# Patient Record
Sex: Female | Born: 1990
Health system: Southern US, Community
[De-identification: ages and names within clinical notes are randomized; demographics above are authoritative.]

## PROBLEM LIST (undated history)

## (undated) ENCOUNTER — Inpatient Hospital Stay (HOSPITAL_COMMUNITY): Payer: Self-pay

## (undated) DIAGNOSIS — F32A Depression, unspecified: Secondary | ICD-10-CM

## (undated) DIAGNOSIS — F329 Major depressive disorder, single episode, unspecified: Secondary | ICD-10-CM

---
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
## 2013-04-16 NOTE — L&D Delivery Note (Signed)
Delivery Note At 5:07 PM a viable and healthy female was delivered via VBAC, Spontaneous (Presentation: Left Occiput Anterior).  APGAR: 8, 9; weight pending .   Placenta status: Intact, Spontaneous.  Cord: 3 vessels with the following complications: None.   Anesthesia: None  Episiotomy: None Lacerations: Second degree perineal and tear of vaginal septum at delivery Suture Repair: 3.0 vicryl.  Pudental placed by Dr Debroah Loop.  Both sides of torn septum ligated by Dr Debroah Loop using 3.0 vicryl.  Repair of second degree perineal tear in usual fashion using local anesthetic.  Est. Blood Loss (mL): 600.  Uterine bleeding minimal, bleeding from torn septum and second degree tear more significant, resolved with suture.  Bleeding minimal after repair.  Mom to postpartum.  Baby to Couplet care / Skin to Skin.  G2P1001  presented to [facility] with hx of PLTCS after pushing x1 hour with vaginal septum as indication for C/S.  Pt had planned a RLTCS scheduled 8/31 but came in to [facility]U 8/30 in active labor at 4-5 cm with BBOW, then progressed quickly to 9 cm.  Discussed risks and benefits of both TOLAC and RLTCS at this point with pt again and pt signed consent to VBAC prior to delivery.  IV pain medication given, pt progressed to complete and pushed effectively to crowning.  At crowning, vaginal septum tore, infant was delivered without complication.  Infant placed in mother's arms, cord clamping delayed by a few minutes, then clamped and cut by CNM.  Placenta delivered spontaneously with cord traction only, uterus firm with minimal bleeding following delivery of placenta.  Pitocin bolus IV following placenta delivery.  Dr Debroah Loop to bedside to perform repair. Following pudendal placement, anterior and posterior sides of vaginal septum ligated with 3.0 vicryl by Dr Debroah Loop.  Repair of second degree perineal tear in usual fashion by Dr Debroah Loop using local anesthetic. Infant skin to skin with mother x 1 hour, both stable.     LEFTWICH-KIRBY, LISA 12/13/2013, 5:59 PM

## 2013-05-12 ENCOUNTER — Other Ambulatory Visit: Payer: Self-pay

## 2013-05-15 ENCOUNTER — Other Ambulatory Visit: Payer: Self-pay | Admitting: Obstetrics & Gynecology

## 2013-05-15 DIAGNOSIS — O3680X0 Pregnancy with inconclusive fetal viability, not applicable or unspecified: Secondary | ICD-10-CM

## 2013-05-18 ENCOUNTER — Other Ambulatory Visit: Payer: Self-pay | Admitting: Obstetrics & Gynecology

## 2013-05-18 ENCOUNTER — Encounter (INDEPENDENT_AMBULATORY_CARE_PROVIDER_SITE_OTHER): Payer: Self-pay

## 2013-05-18 ENCOUNTER — Encounter: Payer: Self-pay | Admitting: Obstetrics & Gynecology

## 2013-05-18 ENCOUNTER — Ambulatory Visit (INDEPENDENT_AMBULATORY_CARE_PROVIDER_SITE_OTHER): Payer: Medicaid Other

## 2013-05-18 DIAGNOSIS — O3680X0 Pregnancy with inconclusive fetal viability, not applicable or unspecified: Secondary | ICD-10-CM

## 2013-05-18 DIAGNOSIS — O34219 Maternal care for unspecified type scar from previous cesarean delivery: Secondary | ICD-10-CM

## 2013-05-18 NOTE — Progress Notes (Signed)
U/S(9+1wks)-single IUP with +FCA noted, FHR- 165 bpm, cx appears closed, bilateral adnexa wnl, CRL c/w LMP dates

## 2013-05-26 ENCOUNTER — Encounter: Payer: Self-pay | Admitting: Advanced Practice Midwife

## 2013-06-01 ENCOUNTER — Ambulatory Visit (INDEPENDENT_AMBULATORY_CARE_PROVIDER_SITE_OTHER): Payer: Medicaid Other | Admitting: Women's Health

## 2013-06-01 ENCOUNTER — Encounter: Payer: Self-pay | Admitting: Women's Health

## 2013-06-01 ENCOUNTER — Other Ambulatory Visit (HOSPITAL_COMMUNITY)
Admission: RE | Admit: 2013-06-01 | Discharge: 2013-06-01 | Disposition: A | Discharge status: Home / Self Care | Payer: Medicaid Other | Source: Ambulatory Visit | Attending: Advanced Practice Midwife | Admitting: Advanced Practice Midwife

## 2013-06-01 VITALS — BP 108/50 | Ht 61.0 in | Wt 170.5 lb

## 2013-06-01 DIAGNOSIS — Z113 Encounter for screening for infections with a predominantly sexual mode of transmission: Secondary | ICD-10-CM | POA: Insufficient documentation

## 2013-06-01 DIAGNOSIS — Z1389 Encounter for screening for other disorder: Secondary | ICD-10-CM

## 2013-06-01 DIAGNOSIS — Z124 Encounter for screening for malignant neoplasm of cervix: Secondary | ICD-10-CM | POA: Insufficient documentation

## 2013-06-01 DIAGNOSIS — Z98891 History of uterine scar from previous surgery: Secondary | ICD-10-CM | POA: Insufficient documentation

## 2013-06-01 DIAGNOSIS — Z1151 Encounter for screening for human papillomavirus (HPV): Secondary | ICD-10-CM | POA: Insufficient documentation

## 2013-06-01 DIAGNOSIS — N9089 Other specified noninflammatory disorders of vulva and perineum: Secondary | ICD-10-CM

## 2013-06-01 DIAGNOSIS — Z3480 Encounter for supervision of other normal pregnancy, unspecified trimester: Secondary | ICD-10-CM

## 2013-06-01 DIAGNOSIS — R87810 Cervical high risk human papillomavirus (HPV) DNA test positive: Secondary | ICD-10-CM | POA: Insufficient documentation

## 2013-06-01 DIAGNOSIS — Z331 Pregnant state, incidental: Secondary | ICD-10-CM

## 2013-06-01 DIAGNOSIS — O99019 Anemia complicating pregnancy, unspecified trimester: Secondary | ICD-10-CM

## 2013-06-01 DIAGNOSIS — O34219 Maternal care for unspecified type scar from previous cesarean delivery: Secondary | ICD-10-CM

## 2013-06-01 LAB — POCT URINALYSIS DIPSTICK
GLUCOSE UA: NEGATIVE
Ketones, UA: NEGATIVE
NITRITE UA: NEGATIVE
PROTEIN UA: NEGATIVE
RBC UA: NEGATIVE

## 2013-06-01 LAB — CBC
HEMATOCRIT: 33.3 % — AB (ref 36.0–46.0)
Hemoglobin: 11 g/dL — ABNORMAL LOW (ref 12.0–15.0)
MCH: 27.2 pg (ref 26.0–34.0)
MCHC: 33 g/dL (ref 30.0–36.0)
MCV: 82.2 fL (ref 78.0–100.0)
PLATELETS: 182 10*3/uL (ref 150–400)
RBC: 4.05 MIL/uL (ref 3.87–5.11)
RDW: 13.8 % (ref 11.5–15.5)
WBC: 8.5 10*3/uL (ref 4.0–10.5)

## 2013-06-01 NOTE — Patient Instructions (Addendum)
Pregnancy - First Trimester  During sexual intercourse, millions of sperm go into the vagina. Only 1 sperm will penetrate and fertilize the female egg while it is in the Fallopian tube. One week later, the fertilized egg implants into the wall of the uterus. An embryo begins to develop into a baby. At 6 to 8 weeks, the eyes and face are formed and the heartbeat can be seen on ultrasound. At the end of 12 weeks (first trimester), all the baby's organs are formed. Now that you are pregnant, you will want to do everything you can to have a healthy baby. Two of the most important things are to get good prenatal care and follow your caregiver's instructions. Prenatal care is all the medical care you receive before the baby's birth. It is given to prevent, find, and treat problems during the pregnancy and childbirth.  PRENATAL EXAMS  · During prenatal visits, your weight, blood pressure, and urine are checked. This is done to make sure you are healthy and progressing normally during the pregnancy.  · A pregnant woman should gain 25 to 35 pounds during the pregnancy. However, if you are overweight or underweight, your caregiver will advise you regarding your weight.  · Your caregiver will ask and answer questions for you.  · Blood work, cervical cultures, other necessary tests, and a Pap test are done during your prenatal exams. These tests are done to check on your health and the probable health of your baby. Tests are strongly recommended and done for HIV with your permission. This is the virus that causes AIDS. These tests are done because medicines can be given to help prevent your baby from being born with this infection should you have been infected without knowing it. Blood work is also used to find out your blood type, previous infections, and follow your blood levels (hemoglobin).  · Low hemoglobin (anemia) is common during pregnancy. Iron and vitamins are given to help prevent this. Later in the pregnancy, blood  tests for diabetes will be done along with any other tests if any problems develop.  · You may need other tests to make sure you and the baby are doing well.  CHANGES DURING THE FIRST TRIMESTER   Your body goes through many changes during pregnancy. They vary from person to person. Talk to your caregiver about changes you notice and are concerned about. Changes can include:  · Your menstrual period stops.  · The egg and sperm carry the genes that determine what you look like. Genes from you and your partner are forming a baby. The female genes determine whether the baby is a boy or a girl.  · Your body increases in girth and you may feel bloated.  · Feeling sick to your stomach (nauseous) and throwing up (vomiting). If the vomiting is uncontrollable, call your caregiver.  · Your breasts will begin to enlarge and become tender.  · Your nipples may stick out more and become darker.  · The need to urinate more. Painful urination may mean you have a bladder infection.  · Tiring easily.  · Loss of appetite.  · Cravings for certain kinds of food.  · At first, you may gain or lose a couple of pounds.  · You may have changes in your emotions from day to day (excited to be pregnant or concerned something may go wrong with the pregnancy and baby).  · You may have more vivid and strange dreams.  HOME CARE INSTRUCTIONS   ·   It is very important to avoid all smoking, alcohol and non-prescribed drugs during your pregnancy. These affect the formation and growth of the baby. Avoid chemicals while pregnant to ensure the delivery of a healthy infant.  · Start your prenatal visits by the 12th week of pregnancy. They are usually scheduled monthly at first, then more often in the last 2 months before delivery. Keep your caregiver's appointments. Follow your caregiver's instructions regarding medicine use, blood and lab tests, exercise, and diet.  · During pregnancy, you are providing food for you and your baby. Eat regular, well-balanced  meals. Choose foods such as meat, fish, milk and other low fat dairy products, vegetables, fruits, and whole-grain breads and cereals. Your caregiver will tell you of the ideal weight gain.  · You can help morning sickness by keeping soda crackers at the bedside. Eat a couple before arising in the morning. You may want to use the crackers without salt on them.  · Eating 4 to 5 small meals rather than 3 large meals a day also may help the nausea and vomiting.  · Drinking liquids between meals instead of during meals also seems to help nausea and vomiting.  · A physical sexual relationship may be continued throughout pregnancy if there are no other problems. Problems may be early (premature) leaking of amniotic fluid from the membranes, vaginal bleeding, or belly (abdominal) pain.  · Exercise regularly if there are no restrictions. Check with your caregiver or physical therapist if you are unsure of the safety of some of your exercises. Greater weight gain will occur in the last 2 trimesters of pregnancy. Exercising will help:  · Control your weight.  · Keep you in shape.  · Prepare you for labor and delivery.  · Help you lose your pregnancy weight after you deliver your baby.  · Wear a good support or jogging bra for breast tenderness during pregnancy. This may help if worn during sleep too.  · Ask when prenatal classes are available. Begin classes when they are offered.  · Do not use hot tubs, steam rooms, or saunas.  · Wear your seat belt when driving. This protects you and your baby if you are in an accident.  · Avoid raw meat, uncooked cheese, cat litter boxes, and soil used by cats throughout the pregnancy. These carry germs that can cause birth defects in the baby.  · The first trimester is a good time to visit your dentist for your dental health. Getting your teeth cleaned is okay. Use a softer toothbrush and brush gently during pregnancy.  · Ask for help if you have financial, counseling, or nutritional needs  during pregnancy. Your caregiver will be able to offer counseling for these needs as well as refer you for other special needs.  · Do not take any medicines or herbs unless told by your caregiver.  · Inform your caregiver if there is any mental or physical domestic violence.  · Make a list of emergency phone numbers of family, friends, hospital, and police and fire departments.  · Write down your questions. Take them to your prenatal visit.  · Do not douche.  · Do not cross your legs.  · If you have to stand for long periods of time, rotate you feet or take small steps in a circle.  · You may have more vaginal secretions that may require a sanitary pad. Do not use tampons or scented sanitary pads.  MEDICINES AND DRUG USE IN PREGNANCY  ·   Take prenatal vitamins as directed. The vitamin should contain 1 milligram of folic acid. Keep all vitamins out of reach of children. Only a couple vitamins or tablets containing iron may be fatal to a baby or young child when ingested.  · Avoid use of all medicines, including herbs, over-the-counter medicines, not prescribed or suggested by your caregiver. Only take over-the-counter or prescription medicines for pain, discomfort, or fever as directed by your caregiver. Do not use aspirin, ibuprofen, or naproxen unless directed by your caregiver.  · Let your caregiver also know about herbs you may be using.  · Alcohol is related to a number of birth defects. This includes fetal alcohol syndrome. All alcohol, in any form, should be avoided completely. Smoking will cause low birth rate and premature babies.  · Street or illegal drugs are very harmful to the baby. They are absolutely forbidden. A baby born to an addicted mother will be addicted at birth. The baby will go through the same withdrawal an adult does.  · Let your caregiver know about any medicines that you have to take and for what reason you take them.  SEEK MEDICAL CARE IF:   You have any concerns or worries during your  pregnancy. It is better to call with your questions if you feel they cannot wait, rather than worry about them.  SEEK IMMEDIATE MEDICAL CARE IF:   · An unexplained oral temperature above 102° F (38.9° C) develops, or as your caregiver suggests.  · You have leaking of fluid from the vagina (birth canal). If leaking membranes are suspected, take your temperature and inform your caregiver of this when you call.  · There is vaginal spotting or bleeding. Notify your caregiver of the amount and how many pads are used.  · You develop a bad smelling vaginal discharge with a change in the color.  · You continue to feel sick to your stomach (nauseated) and have no relief from remedies suggested. You vomit blood or coffee ground-like materials.  · You lose more than 2 pounds of weight in 1 week.  · You gain more than 2 pounds of weight in 1 week and you notice swelling of your face, hands, feet, or legs.  · You gain 5 pounds or more in 1 week (even if you do not have swelling of your hands, face, legs, or feet).  · You get exposed to German measles and have never had them.  · You are exposed to fifth disease or chickenpox.  · You develop belly (abdominal) pain. Round ligament discomfort is a common non-cancerous (benign) cause of abdominal pain in pregnancy. Your caregiver still must evaluate this.  · You develop headache, fever, diarrhea, pain with urination, or shortness of breath.  · You fall or are in a car accident or have any kind of trauma.  · There is mental or physical violence in your home.  Document Released: 03/27/2001 Document Revised: 12/26/2011 Document Reviewed: 09/28/2008  ExitCare® Patient Information ©2014 [employer].

## 2013-06-01 NOTE — Progress Notes (Addendum)
  Subjective:  Brandy Kaufman is a 23 y.o. 632P1001 Hispanic female at 6673w1d by LMP which differs by 9wk u/s by only 2d,being seen today for her first obstetrical visit.  Her obstetrical history is significant for prev c/s after IOL 2/2 term 2010 d/t 'tissue that wasn't tearing' after 1hr of pushing? Will request records.  Pregnancy history fully reviewed.  Patient reports no complaints. Denies vb, cramping, uti s/s, abnormal/malodorous vag d/c, or vulvovaginal itching/irritation.  BP 108/50  Wt 170 lb 8 oz (77.338 kg)  LMP 03/15/2013  HISTORY: OB History  Gravida Para Term Preterm AB SAB TAB Ectopic Multiple Living  2 1 1       1     # Outcome Date GA Lbr Len/2nd Weight Sex Delivery Anes PTL Lv  2 CUR           1 TRM 10/12/08 6737w0d  7 lb 0.5 oz (3.189 kg) F LTCS EPI  Y     History reviewed. No pertinent past medical history. Past Surgical History  Procedure Laterality Date  . Cesarean section     Family History  Problem Relation Age of Onset  . Diabetes Father     Exam   System:     General: Well developed & nourished, no acute distress   Skin: Warm & dry, normal coloration and turgor, no rashes   Neurologic: Alert & oriented, normal mood   Cardiovascular: Regular rate & rhythm   Respiratory: Effort & rate normal, LCTAB, acyanotic   Abdomen: Soft, non tender   Extremities: normal strength, tone   Pelvic Exam:    Perineum: Normal perineum   Vulva: Normal, multiple small lesions w/ 1 beginning to look slightly ulcerated- HSV culture obtained   Vagina:  Normal mucosa, normal discharge   Cervix: Normal, bulbous, appears closed   Uterus: Normal size/shape/contour for GA   Thin prep pap smear obtained  FHR: 166 via doppler   Assessment:   Pregnancy: G2P1001 Patient Active Problem List   Diagnosis Date Noted  . Supervision of other normal pregnancy 06/01/2013    Priority: High  . Previous cesarean section 06/01/2013    Priority: High    3973w1d G2P1001 New OB  visit Prev c/s after 1hr 2nd stage d/t ?tissue that wouldn't tear   Plan:  Initial labs drawn, including HSV 1&2, and culture of lesion sent Continue prenatal vitamins Problem list reviewed and updated Reviewed n/v relief measures and warning s/s to report Reviewed recommended weight gain based on pre-gravid BMI Encouraged well-balanced diet Genetic Screening discussed Integrated Screen: requested Cystic fibrosis screening discussed declined Ultrasound discussed; fetal survey: requested Follow up in asap for 1st it/nt, then 4wks for lrob CCNC completed [facility]  Marge DuncansBooker, Gotti Alwin Randall CNM, [facility]NP-BC 06/01/2013 10:51 AM

## 2013-06-01 NOTE — Addendum Note (Signed)
Addended by: Shawna ClampBOOKER, Symphanie Cederberg R on: 06/01/2013 11:10 AM   Modules accepted: Orders

## 2013-06-02 LAB — RUBELLA SCREEN: Rubella: 2.58 Index — ABNORMAL HIGH (ref <0.90)

## 2013-06-02 LAB — ABO AND RH: RH TYPE: POSITIVE

## 2013-06-02 LAB — URINALYSIS
BILIRUBIN URINE: NEGATIVE
Glucose, UA: NEGATIVE mg/dL
HGB URINE DIPSTICK: NEGATIVE
Ketones, ur: NEGATIVE mg/dL
Leukocytes, UA: NEGATIVE
Nitrite: NEGATIVE
PROTEIN: NEGATIVE mg/dL
Specific Gravity, Urine: 1.024 (ref 1.005–1.030)
UROBILINOGEN UA: 0.2 mg/dL (ref 0.0–1.0)
pH: 6.5 (ref 5.0–8.0)

## 2013-06-02 LAB — VARICELLA ZOSTER ANTIBODY, IGG: VARICELLA IGG: 449.1 {index} — AB (ref <135.00)

## 2013-06-02 LAB — HSV 2 ANTIBODY, IGG

## 2013-06-02 LAB — RPR

## 2013-06-02 LAB — HEPATITIS B SURFACE ANTIGEN: HEP B S AG: NEGATIVE

## 2013-06-02 LAB — HIV ANTIBODY (ROUTINE TESTING W REFLEX): HIV: NONREACTIVE

## 2013-06-02 LAB — SICKLE CELL SCREEN: SICKLE CELL SCREEN: NEGATIVE

## 2013-06-02 LAB — ANTIBODY SCREEN: ANTIBODY SCREEN: NEGATIVE

## 2013-06-02 LAB — OXYCODONE SCREEN, UA, RFLX CONFIRM: Oxycodone Screen, Ur: NEGATIVE ng/mL

## 2013-06-02 LAB — HSV 1 ANTIBODY, IGG: HSV 1 Glycoprotein G Ab, IgG: 10.43 IV — ABNORMAL HIGH

## 2013-06-03 LAB — DRUG SCREEN, URINE, NO CONFIRMATION
AMPHETAMINE SCRN UR: NEGATIVE
Barbiturate Quant, Ur: NEGATIVE
Benzodiazepines.: NEGATIVE
Cocaine Metabolites: NEGATIVE
Creatinine,U: 165.8 mg/dL
Marijuana Metabolite: NEGATIVE
Methadone: NEGATIVE
Opiate Screen, Urine: NEGATIVE
Phencyclidine (PCP): NEGATIVE
Propoxyphene: NEGATIVE

## 2013-06-03 LAB — URINE CULTURE

## 2013-06-03 LAB — HERPES SIMPLEX VIRUS CULTURE: Organism ID, Bacteria: NOT DETECTED

## 2013-06-08 ENCOUNTER — Encounter: Payer: Self-pay | Admitting: Women's Health

## 2013-06-08 DIAGNOSIS — R87619 Unspecified abnormal cytological findings in specimens from cervix uteri: Secondary | ICD-10-CM | POA: Insufficient documentation

## 2013-06-08 DIAGNOSIS — O3460 Maternal care for abnormality of vagina, unspecified trimester: Secondary | ICD-10-CM | POA: Insufficient documentation

## 2013-06-08 DIAGNOSIS — Q5210 Doubling of vagina, unspecified: Secondary | ICD-10-CM

## 2013-06-10 ENCOUNTER — Ambulatory Visit (INDEPENDENT_AMBULATORY_CARE_PROVIDER_SITE_OTHER): Payer: Medicaid Other

## 2013-06-10 ENCOUNTER — Encounter: Payer: Self-pay | Admitting: Women's Health

## 2013-06-10 ENCOUNTER — Other Ambulatory Visit: Payer: Self-pay | Admitting: Women's Health

## 2013-06-10 ENCOUNTER — Ambulatory Visit (INDEPENDENT_AMBULATORY_CARE_PROVIDER_SITE_OTHER): Payer: Medicaid Other | Admitting: Women's Health

## 2013-06-10 VITALS — BP 100/64 | Wt 171.0 lb

## 2013-06-10 DIAGNOSIS — Z1389 Encounter for screening for other disorder: Secondary | ICD-10-CM

## 2013-06-10 DIAGNOSIS — O34219 Maternal care for unspecified type scar from previous cesarean delivery: Secondary | ICD-10-CM

## 2013-06-10 DIAGNOSIS — B009 Herpesviral infection, unspecified: Secondary | ICD-10-CM | POA: Insufficient documentation

## 2013-06-10 DIAGNOSIS — O99019 Anemia complicating pregnancy, unspecified trimester: Secondary | ICD-10-CM

## 2013-06-10 DIAGNOSIS — Z3480 Encounter for supervision of other normal pregnancy, unspecified trimester: Secondary | ICD-10-CM

## 2013-06-10 DIAGNOSIS — Z331 Pregnant state, incidental: Secondary | ICD-10-CM

## 2013-06-10 DIAGNOSIS — Z36 Encounter for antenatal screening of mother: Secondary | ICD-10-CM

## 2013-06-10 LAB — POCT URINALYSIS DIPSTICK
Glucose, UA: NEGATIVE
KETONES UA: NEGATIVE
Leukocytes, UA: NEGATIVE
Nitrite, UA: NEGATIVE
PROTEIN UA: NEGATIVE

## 2013-06-10 NOTE — Progress Notes (Signed)
U/S(12+3wks)-single active fetus, CRL c/w dates, cx appears closed, bilateral adnexa wnl, NB present, NT-1.249mm, FHR-165bpm, posterior Gr 0 placenta

## 2013-06-10 NOTE — Progress Notes (Signed)
Denies cramping, lof, vb, uti s/s.  No complaints.  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 Offered antiviral- pt states area isn't bothering her right now. To call if she changes her mind. Also reviewed warning s/s to report.  All questions answered. F/U in 4wks for 2nd IT and visit w/ MD for colpo.  1st IT/NT today.

## 2013-06-10 NOTE — Patient Instructions (Signed)
Colposcopy  Colposcopy is a procedure to examine your cervix and vagina, or the area around the outside of your vagina, for abnormalities or signs of disease. The procedure is done using a lighted microscope called a colposcope. Tissue samples may be collected during the colposcopy if your health care provider finds any unusual cells. A colposcopy may be done if a woman has:   An abnormal Pap test. A Pap test is a medical test done to evaluate cells that are on the surface of the cervix.   A Pap test result that is suggestive of human papillomavirus (HPV). This virus can cause genital warts and is linked to the development of cervical cancer.   A sore on her cervix and the results of a Pap test were normal.   Genital warts on the cervix or in or around the outside of the vagina.   A mother who took the drug diethylstilbestrol (DES) while pregnant.   Painful intercourse.   Vaginal bleeding, especially after sexual intercourse.  LET YOUR HEALTH CARE PROVIDER KNOW ABOUT:   Any allergies you have.   All medicines you are taking, including vitamins, herbs, eye drops, creams, and over-the-counter medicines.   Previous problems you or members of your family have had with the use of anesthetics.   Any blood disorders you have.   Previous surgeries you have had.   Medical conditions you have.  RISKS AND COMPLICATIONS  Generally, a colposcopy is a safe procedure. However, as with any procedure, complications can occur. Possible complications include:   Bleeding.   Infection.   Missed lesions.  BEFORE THE PROCEDURE    Tell your health care provider if you have your menstrual period. A colposcopy typically is not done during menstruation.   For 24 hours before the colposcopy, do not:   Douche.   Use tampons.   Use medicines, creams, or suppositories in the vagina.   Have sexual intercourse.  PROCEDURE   During the procedure, you will be lying on your back with your feet in foot rests (stirrups). A warm  metal or plastic instrument (speculum) will be placed in your vagina to keep it open and to allow the health care provider to see the cervix. The colposcope will be placed outside the vagina. It will be used to magnify and examine the cervix, vagina, and the area around the outside of the vagina. A small amount of liquid solution will be placed on the area that is to be viewed. This solution will make it easier to see the abnormal cells. Your health care provider will use tools to suck out mucus and cells from the canal of the cervix. Then he or she will record the location of the abnormal areas.  If a biopsy is done during the procedure, a medicine will usually be given to numb the area (local anesthetic). You may feel mild pain or cramping while the biopsy is done. After the procedure, tissue samples collected during the biopsy will be sent to a lab for analysis.  AFTER THE PROCEDURE   You will be given instructions on when to follow up with your health care provider for your test results. It is important to keep your appointment.  Document Released: 06/23/2002 Document Revised: 12/03/2012 Document Reviewed: 10/30/2012  Exit[employer] Patient Information 2014 [employer].

## 2013-06-16 LAB — MATERNAL SCREEN, INTEGRATED #1

## 2013-06-29 ENCOUNTER — Encounter: Payer: Self-pay | Admitting: Obstetrics & Gynecology

## 2013-07-02 ENCOUNTER — Ambulatory Visit (INDEPENDENT_AMBULATORY_CARE_PROVIDER_SITE_OTHER): Payer: Self-pay | Admitting: Obstetrics & Gynecology

## 2013-07-02 ENCOUNTER — Encounter: Payer: Self-pay | Admitting: Obstetrics & Gynecology

## 2013-07-02 VITALS — BP 112/50 | Wt 172.0 lb

## 2013-07-02 DIAGNOSIS — Z1389 Encounter for screening for other disorder: Secondary | ICD-10-CM

## 2013-07-02 DIAGNOSIS — O34219 Maternal care for unspecified type scar from previous cesarean delivery: Secondary | ICD-10-CM

## 2013-07-02 DIAGNOSIS — Z3480 Encounter for supervision of other normal pregnancy, unspecified trimester: Secondary | ICD-10-CM

## 2013-07-02 DIAGNOSIS — Z331 Pregnant state, incidental: Secondary | ICD-10-CM

## 2013-07-02 LAB — POCT URINALYSIS DIPSTICK
GLUCOSE UA: NEGATIVE
Ketones, UA: NEGATIVE
Leukocytes, UA: NEGATIVE
Nitrite, UA: NEGATIVE
Protein, UA: NEGATIVE

## 2013-07-02 MED ORDER — PRENATAL 27-0.8 MG PO TABS
1.0000 | ORAL_TABLET | Freq: Every day | ORAL | Status: DC
Start: 2013-07-02 — End: 2013-12-13

## 2013-07-02 NOTE — Progress Notes (Signed)
coloposcopy normal repeat at 6-8 weeks post partum  BP weight and urine results all reviewed and noted. Patient reports good fetal movement, denies any bleeding and no rupture of membranes symptoms or regular contractions. Patient is without complaints. All questions were answered.  2nd IT today 20 week scan next visit

## 2013-07-02 NOTE — Addendum Note (Signed)
Addended by: Lazaro ArmsEURE, Don Giarrusso H on: 07/02/2013 12:44 PM   Modules accepted: Orders

## 2013-07-08 LAB — MATERNAL SCREEN, INTEGRATED #2
AFP MoM: 0.66
AFP, Serum: 17.6 ng/mL
CROWN RUMP LENGTH MAT SCREEN 2: 63.3 mm
Calculated Gestational Age: 15.7
ESTRIOL FREE MAT SCREEN: 0.57 ng/mL
ESTRIOL MOM MAT SCREEN: 0.75
HCG, MOM MAT SCREEN: 0.46
HCG, SERUM MAT SCREEN: 16 [IU]/mL
INHIBIN A MOM MAT SCREEN: 0.61
Inhibin A Dimeric: 95 pg/mL
MSS Down Syndrome: <1:5000 {titer}
MSS Trisomy 18 Risk: 1:1000 {titer}
NT MoM: 1.32
Nuchal Translucency: 1.9 mm
Number of fetuses: 1
PAPP-A MoM: 0.29
PAPP-A: 220 ng/mL

## 2013-07-30 ENCOUNTER — Other Ambulatory Visit: Payer: Self-pay | Admitting: Obstetrics & Gynecology

## 2013-07-30 ENCOUNTER — Ambulatory Visit (INDEPENDENT_AMBULATORY_CARE_PROVIDER_SITE_OTHER): Payer: Self-pay | Admitting: Advanced Practice Midwife

## 2013-07-30 ENCOUNTER — Ambulatory Visit (INDEPENDENT_AMBULATORY_CARE_PROVIDER_SITE_OTHER): Payer: Self-pay

## 2013-07-30 VITALS — BP 104/58 | Wt 174.0 lb

## 2013-07-30 DIAGNOSIS — Z1389 Encounter for screening for other disorder: Secondary | ICD-10-CM

## 2013-07-30 DIAGNOSIS — O99019 Anemia complicating pregnancy, unspecified trimester: Secondary | ICD-10-CM

## 2013-07-30 DIAGNOSIS — Z331 Pregnant state, incidental: Secondary | ICD-10-CM

## 2013-07-30 DIAGNOSIS — O34219 Maternal care for unspecified type scar from previous cesarean delivery: Secondary | ICD-10-CM

## 2013-07-30 DIAGNOSIS — R87619 Unspecified abnormal cytological findings in specimens from cervix uteri: Secondary | ICD-10-CM

## 2013-07-30 DIAGNOSIS — Z3480 Encounter for supervision of other normal pregnancy, unspecified trimester: Secondary | ICD-10-CM

## 2013-07-30 DIAGNOSIS — Z98891 History of uterine scar from previous surgery: Secondary | ICD-10-CM

## 2013-07-30 LAB — POCT URINALYSIS DIPSTICK
Blood, UA: NEGATIVE
Glucose, UA: NEGATIVE
Ketones, UA: NEGATIVE
LEUKOCYTES UA: NEGATIVE
NITRITE UA: NEGATIVE
PROTEIN UA: NEGATIVE

## 2013-07-30 NOTE — Progress Notes (Signed)
U/S(19+4wks)-active fetus, meas c/w dates, fluid wnl, cx appears closed (3.5cm), bilateral adnexa WNL posterior/fundal Gr 0 placenta, female fetus, no major abnl noted, FHR-146 bpm

## 2013-07-30 NOTE — Progress Notes (Signed)
Had normal anatomy scan today.    No c/o at this time.  Routine questions about pregnancy answered.  F/U in 4 weeks for Low-risk ob appt .

## 2013-08-27 ENCOUNTER — Encounter: Payer: Self-pay | Admitting: Advanced Practice Midwife

## 2013-08-27 ENCOUNTER — Ambulatory Visit (INDEPENDENT_AMBULATORY_CARE_PROVIDER_SITE_OTHER): Payer: Self-pay | Admitting: Advanced Practice Midwife

## 2013-08-27 VITALS — BP 102/54 | Wt 176.0 lb

## 2013-08-27 DIAGNOSIS — Z331 Pregnant state, incidental: Secondary | ICD-10-CM

## 2013-08-27 DIAGNOSIS — Q5210 Doubling of vagina, unspecified: Secondary | ICD-10-CM

## 2013-08-27 DIAGNOSIS — O3460 Maternal care for abnormality of vagina, unspecified trimester: Secondary | ICD-10-CM

## 2013-08-27 DIAGNOSIS — Z1389 Encounter for screening for other disorder: Secondary | ICD-10-CM

## 2013-08-27 LAB — POCT URINALYSIS DIPSTICK
Blood, UA: NEGATIVE
Glucose, UA: NEGATIVE
Ketones, UA: NEGATIVE
LEUKOCYTES UA: NEGATIVE
NITRITE UA: NEGATIVE
Protein, UA: NEGATIVE

## 2013-08-27 NOTE — Progress Notes (Signed)
No c/o at this time.  Routine questions about pregnancy answered.  F/U in 4 weeks for PN2/Low-risk ob appt .

## 2013-08-27 NOTE — Patient Instructions (Signed)
1. Before your test, do not eat or drink anything for 8-10 hours prior to your  appointment (a small amount of water is allowed and you may take any medicines you normally take). 2. When you arrive, your blood will be drawn for a 'fasting' blood sugar level.  Then you will be given a sweetened carbonated beverage to drink. You should  complete drinking this beverage within five minutes. After finishing the  beverage, you will have your blood drawn exactly 1 and 2 hours later. Having  your blood drawn on time is an important part of this test. A total of three blood  samples will be done. 3. The test takes approximately 2  hours. During the test, do not have anything to  eat or drink. Do not smoke, chew gum (not even sugarless gum) or use breath mints.  4. During the test you should remain close by and seated as much as possible and  avoid walking around. You may want to bring a book or something else to  occupy your time.  5. After your test, you may eat and drink as normal. You may want to bring a snack  to eat after the test is finished. Your provider will advise you as to the results of  this test and any follow-up if necessary  You will also be retested for syphilis, HIV and blood levels (anemia):  You were already tested in the first trimester, but [place] recommends retesting.  Additionally, you will be tested for Type 2 Herpes. MOST people do not know that they have genital herpes, as only around 15% of people have outbreaks.  However, it is still transmittable to other people, including the baby (but only during the birth).  If you test positive for Type 2 Herpes, we place you on a medicine called acyclovir the last 6 weeks of your pregnancy to prevent transmission of the virus to the baby during the birth.    If your sugar test is positive for gestational diabetes, you will be given an phone call and further instructions discussed.  We typically do not call patients with positive  herpes results, but will discuss it at your next appointment.  If you wish to know all of your test results before your next appointment, feel free to call the office, or look up your test results on Mychart.  (The range that the lab uses for normal values of the sugar test are not necessarily the range that is used for pregnant women; if your results are within the range, they are definitely normal.  However, if a value is deemed "high" by the lab, it may not be too high for a pregnant woman.  We will need to discuss the normal range if your value(s) fall in the "high" category).     

## 2013-09-24 ENCOUNTER — Encounter: Payer: Self-pay | Admitting: Obstetrics & Gynecology

## 2013-09-24 ENCOUNTER — Encounter: Payer: Self-pay | Admitting: Advanced Practice Midwife

## 2013-09-24 ENCOUNTER — Other Ambulatory Visit: Payer: Self-pay

## 2013-09-28 ENCOUNTER — Encounter: Payer: Self-pay | Admitting: Obstetrics and Gynecology

## 2013-09-28 ENCOUNTER — Other Ambulatory Visit: Payer: Self-pay

## 2013-09-28 ENCOUNTER — Ambulatory Visit (INDEPENDENT_AMBULATORY_CARE_PROVIDER_SITE_OTHER): Payer: Self-pay | Admitting: Obstetrics and Gynecology

## 2013-09-28 VITALS — BP 100/60 | Wt 175.0 lb

## 2013-09-28 DIAGNOSIS — Z3480 Encounter for supervision of other normal pregnancy, unspecified trimester: Secondary | ICD-10-CM

## 2013-09-28 DIAGNOSIS — Z331 Pregnant state, incidental: Secondary | ICD-10-CM

## 2013-09-28 DIAGNOSIS — Z1389 Encounter for screening for other disorder: Secondary | ICD-10-CM

## 2013-09-28 LAB — POCT URINALYSIS DIPSTICK
Blood, UA: NEGATIVE
GLUCOSE UA: NEGATIVE
KETONES UA: NEGATIVE
Leukocytes, UA: NEGATIVE
Nitrite, UA: NEGATIVE
Protein, UA: NEGATIVE

## 2013-09-28 LAB — CBC
HCT: 32.3 % — ABNORMAL LOW (ref 36.0–46.0)
HEMOGLOBIN: 11 g/dL — AB (ref 12.0–15.0)
MCH: 27.9 pg (ref 26.0–34.0)
MCHC: 34.1 g/dL (ref 30.0–36.0)
MCV: 82 fL (ref 78.0–100.0)
PLATELETS: 149 10*3/uL — AB (ref 150–400)
RBC: 3.94 MIL/uL (ref 3.87–5.11)
RDW: 14 % (ref 11.5–15.5)
WBC: 8 10*3/uL (ref 4.0–10.5)

## 2013-09-28 NOTE — Progress Notes (Signed)
G2P1001 7142w1d Estimated Date of Delivery: 12/20/13  Blood pressure 100/60, weight 175 lb (79.379 kg), last menstrual period 03/15/2013.   BP weight and urine results all reviewed and noted.  Please refer to the obstetrical flow sheet for the fundal height and fetal heart rate documentation:  Patient reports good fetal movement, denies any bleeding and no rupture of membranes symptoms or regular contractions. Patient is without complaints. All questions were answered.  Plan:  Continued routine obstetrical care, pelvic exam to examine vaginal septum  Follow up in 4 weeks for OB appointment

## 2013-09-28 NOTE — Progress Notes (Signed)
Pt denies any problems or concerns at this time.  

## 2013-09-29 LAB — GLUCOSE TOLERANCE, 2 HOURS W/ 1HR
Glucose, 1 hour: 99 mg/dL (ref 70–170)
Glucose, 2 hour: 108 mg/dL (ref 70–139)
Glucose, Fasting: 72 mg/dL (ref 70–99)

## 2013-09-29 LAB — ANTIBODY SCREEN: Antibody Screen: NEGATIVE

## 2013-09-29 LAB — RPR

## 2013-09-29 LAB — HIV ANTIBODY (ROUTINE TESTING W REFLEX): HIV: NONREACTIVE

## 2013-09-29 LAB — HSV 2 ANTIBODY, IGG: HSV 2 Glycoprotein G Ab, IgG: <0.1 IV

## 2013-10-01 ENCOUNTER — Other Ambulatory Visit: Payer: Self-pay

## 2013-10-05 ENCOUNTER — Encounter (HOSPITAL_COMMUNITY): Payer: Self-pay | Admitting: Emergency Medicine

## 2013-10-05 ENCOUNTER — Emergency Department (HOSPITAL_COMMUNITY)
Admission: EM | Admit: 2013-10-05 | Discharge: 2013-10-05 | Disposition: A | Discharge status: Home / Self Care | Payer: Medicaid Other | Attending: Emergency Medicine | Admitting: Emergency Medicine

## 2013-10-05 DIAGNOSIS — O211 Hyperemesis gravidarum with metabolic disturbance: Secondary | ICD-10-CM | POA: Insufficient documentation

## 2013-10-05 DIAGNOSIS — Z3490 Encounter for supervision of normal pregnancy, unspecified, unspecified trimester: Secondary | ICD-10-CM

## 2013-10-05 DIAGNOSIS — I951 Orthostatic hypotension: Secondary | ICD-10-CM

## 2013-10-05 DIAGNOSIS — O2650 Maternal hypotension syndrome, unspecified trimester: Secondary | ICD-10-CM | POA: Insufficient documentation

## 2013-10-05 DIAGNOSIS — E860 Dehydration: Secondary | ICD-10-CM

## 2013-10-05 LAB — URINALYSIS, ROUTINE W REFLEX MICROSCOPIC
Bilirubin Urine: NEGATIVE
GLUCOSE, UA: NEGATIVE mg/dL
HGB URINE DIPSTICK: NEGATIVE
Ketones, ur: 15 mg/dL — AB
Leukocytes, UA: NEGATIVE
Nitrite: NEGATIVE
Protein, ur: NEGATIVE mg/dL
Specific Gravity, Urine: 1.02 (ref 1.005–1.030)
Urobilinogen, UA: 0.2 mg/dL (ref 0.0–1.0)
pH: 6.5 (ref 5.0–8.0)

## 2013-10-05 LAB — CBC WITH DIFFERENTIAL/PLATELET
Basophils Absolute: 0 10*3/uL (ref 0.0–0.1)
Basophils Relative: 0 % (ref 0–1)
EOS PCT: 1 % (ref 0–5)
Eosinophils Absolute: 0.1 10*3/uL (ref 0.0–0.7)
HEMATOCRIT: 30.5 % — AB (ref 36.0–46.0)
Hemoglobin: 10.2 g/dL — ABNORMAL LOW (ref 12.0–15.0)
LYMPHS PCT: 19 % (ref 12–46)
Lymphs Abs: 1.6 10*3/uL (ref 0.7–4.0)
MCH: 28 pg (ref 26.0–34.0)
MCHC: 33.4 g/dL (ref 30.0–36.0)
MCV: 83.8 fL (ref 78.0–100.0)
MONO ABS: 0.6 10*3/uL (ref 0.1–1.0)
Monocytes Relative: 7 % (ref 3–12)
Neutro Abs: 6.4 10*3/uL (ref 1.7–7.7)
Neutrophils Relative %: 73 % (ref 43–77)
Platelets: 153 10*3/uL (ref 150–400)
RBC: 3.64 MIL/uL — ABNORMAL LOW (ref 3.87–5.11)
RDW: 13.2 % (ref 11.5–15.5)
WBC: 8.7 10*3/uL (ref 4.0–10.5)

## 2013-10-05 LAB — BASIC METABOLIC PANEL
BUN: 8 mg/dL (ref 6–23)
CO2: 21 meq/L (ref 19–32)
CREATININE: 0.65 mg/dL (ref 0.50–1.10)
Calcium: 8.6 mg/dL (ref 8.4–10.5)
Chloride: 100 mEq/L (ref 96–112)
GFR calc Af Amer: >90 mL/min (ref >90)
GFR calc non Af Amer: >90 mL/min (ref >90)
Glucose, Bld: 110 mg/dL — ABNORMAL HIGH (ref 70–99)
Potassium: 3.5 mEq/L — ABNORMAL LOW (ref 3.7–5.3)
Sodium: 136 mEq/L — ABNORMAL LOW (ref 137–147)

## 2013-10-05 MED ORDER — SODIUM CHLORIDE 0.9 % IV BOLUS (SEPSIS)
1000.0000 mL | Freq: Once | INTRAVENOUS | Status: AC
  Administered 2013-10-05: 1000 mL via INTRAVENOUS

## 2013-10-05 NOTE — ED Provider Notes (Signed)
CSN: [id number]     Arrival date & time 10/05/13  0034 History  This chart was scribed for Joya Gaskinsonald W Wickline, MD by Chestine SporeSoijett Blue, ED Scribe. The patient was seen in room APA01/APA01 at 12:47 AM.     Chief Complaint  Patient presents with  . Hypotension     The history is provided by the patient. No language interpreter was used.     HPI Comments: Brandy Kaufman is a 23 y.o. female who presents to the Emergency Department complaining of Hypotension. She states that she was getting ready for bed and felt like she needed air. She states that she walked out the bedroom door and her parents were in the living room and she felt like she was going to faint, but did not. She states that she checked her BP and it was low (104/44). She reports associated symptoms of weakness. She denies fevers, HA, vision loss, vomiting, diarrhea, dysuria, CP, SOB, abdominal pain, bleeding, loss of fluids, falling, or LOC. She does not take any other medications other than vitamins. She denies any medical h/o of heart problems. She has been eating and drinking okay. She states that she has not been out in the heat lately. She denies contractions at this moment. She states that she can feel the baby move.    PMH - pregnant  Past Surgical History  Procedure Laterality Date  . Cesarean section     Family History  Problem Relation Age of Onset  . Diabetes Father    History  Substance Use Topics  . Smoking status: Never Smoker   . Smokeless tobacco: Never Used  . Alcohol Use: No   OB History   Grav Para Term Preterm Abortions TAB SAB Ect Mult Living   2 1 1       1      Review of Systems  Constitutional: Positive for fatigue. Negative for fever.  Eyes: Negative for visual disturbance.  Respiratory: Negative for shortness of breath.   Cardiovascular: Negative for chest pain.  Gastrointestinal: Negative for vomiting, abdominal pain and diarrhea.  Genitourinary: Negative for dysuria, vaginal bleeding and  vaginal discharge.  Neurological: Positive for weakness. Negative for dizziness, syncope and headaches.  All other systems reviewed and are negative.     Allergies  Review of patient's allergies indicates no known allergies.  Home Medications   Prior to Admission medications   Medication Sig Start Date End Date Taking? Authorizing Provider  Prenatal Vit-Fe Fumarate-FA (MULTIVITAMIN-PRENATAL) 27-0.8 MG TABS tablet Take 1 tablet by mouth daily at 12 noon. 07/02/13  Yes Lazaro ArmsLuther H Eure, MD   BP 113/70  Pulse 80  Temp(Src) 98.4 F (36.9 C) (Oral)  Resp 18  Ht 5\' 1"  (1.549 m)  Wt 173 lb (78.472 kg)  BMI 32.70 kg/m2  SpO2 100%  LMP 03/15/2013   Physical Exam  CONSTITUTIONAL: Well developed/well nourished HEAD: Normocephalic/atraumatic EYES: EOMI/PERRL ENMT: Mucous membranes moist NECK: supple no meningeal signs SPINE:entire spine nontender CV: S1/S2 noted, no murmurs/rubs/gallops noted LUNGS: Lungs are clear to auscultation bilaterally, no apparent distress ABDOMEN: soft, nontender, no rebound or guarding GU:no cva tenderness NEURO: Pt is awake/alert, moves all extremitiesx4, no arm or leg drift noted. EXTREMITIES: pulses normal, full ROM, no calf tenderness or edema.  SKIN: warm, color normal PSYCH: no abnormalities of mood noted  ED Course  Procedures  DIAGNOSTIC STUDIES: Oxygen Saturation is 100% on room air, normal by my interpretation.    COORDINATION OF CARE: 12:51 AM-Discussed treatment plan which includes  labs with pt at bedside and pt agreed to plan.  Pt currently 29 weeks without any significant prenatal complications here for near syncopal episode She is currently stable She reports fetal movement, she denies abd pain/contractions/vaginal bleeding.  Fetal heart tones appropriate (140s) She denies cp/sob Will rehydrate and reassess 2:28 AM Pt feels improved She is taking PO fluids She denies abd pain +fetal movement She denied contractions, no abd pain,  no vag bleeding, no syncope/trauma reported, and fetal heart tones appropriate (fetal monitoring not continued) She is ambulatory without difficulty Suspicion for acute obstetric emergency is low I doubt acute cardiopulmonary emergency at this time Advised need for frequent rest and to increase fluid intake (daily temps outside >90) Pt is agreeable with plan  Labs Review Labs Reviewed  CBC WITH DIFFERENTIAL - Abnormal; Notable for the following:    RBC 3.64 (*)    Hemoglobin 10.2 (*)    HCT 30.5 (*)    All other components within normal limits  BASIC METABOLIC PANEL - Abnormal; Notable for the following:    Sodium 136 (*)    Potassium 3.5 (*)    Glucose, Bld 110 (*)    All other components within normal limits  URINALYSIS, ROUTINE W REFLEX MICROSCOPIC - Abnormal; Notable for the following:    Ketones, ur 15 (*)    All other components within normal limits     EKG Interpretation   Date/Time:  Monday October 05 2013 00:56:28 EDT Ventricular Rate:  80 PR Interval:  143 QRS Duration: 89 QT Interval:  378 QTC Calculation: 436 R Axis:   59 Text Interpretation:  Sinus rhythm Normal ECG No previous ECGs available  Confirmed by Bebe ShaggyWICKLINE  MD, Dorinda HillNALD ([id number]) on 10/05/2013 1:15:38 AM      MDM   Final diagnoses:  Dehydration  Orthostatic hypotension  Pregnancy    Nursing notes including past medical history and social history reviewed and considered in documentation Labs/vital reviewed and considered   I personally performed the services described in this documentation, which was scribed in my presence. The recorded information has been reviewed and is accurate.      Joya Gaskinsonald W Wickline, MD 10/05/13 [phone]

## 2013-10-05 NOTE — ED Notes (Signed)
Patient states was getting ready for bed and felt like she needed air.  Patient states she checked her BP at home and it was low 104/44.

## 2013-10-05 NOTE — ED Notes (Signed)
Family at bedside. Reminded patient  that urine specimen still needed. Patient tearful at this time.

## 2013-10-26 ENCOUNTER — Ambulatory Visit (INDEPENDENT_AMBULATORY_CARE_PROVIDER_SITE_OTHER): Payer: Self-pay | Admitting: Obstetrics and Gynecology

## 2013-10-26 ENCOUNTER — Encounter: Payer: Self-pay | Admitting: Obstetrics and Gynecology

## 2013-10-26 VITALS — BP 120/80 | Wt 181.0 lb

## 2013-10-26 DIAGNOSIS — Q5210 Doubling of vagina, unspecified: Secondary | ICD-10-CM

## 2013-10-26 DIAGNOSIS — O3460 Maternal care for abnormality of vagina, unspecified trimester: Secondary | ICD-10-CM

## 2013-10-26 DIAGNOSIS — Z331 Pregnant state, incidental: Secondary | ICD-10-CM

## 2013-10-26 DIAGNOSIS — Z3480 Encounter for supervision of other normal pregnancy, unspecified trimester: Secondary | ICD-10-CM

## 2013-10-26 DIAGNOSIS — Z1389 Encounter for screening for other disorder: Secondary | ICD-10-CM

## 2013-10-26 DIAGNOSIS — Z3493 Encounter for supervision of normal pregnancy, unspecified, third trimester: Secondary | ICD-10-CM

## 2013-10-26 LAB — POCT URINALYSIS DIPSTICK
Blood, UA: NEGATIVE
Glucose, UA: NEGATIVE
Ketones, UA: NEGATIVE
Leukocytes, UA: NEGATIVE
NITRITE UA: NEGATIVE
Protein, UA: NEGATIVE

## 2013-10-26 NOTE — Progress Notes (Signed)
G2P1001 4754w1d Estimated Date of Delivery: 12/20/13  Blood pressure 120/80, weight 82.101 kg (181 lb), last menstrual period 03/15/2013.   BP weight and urine results all reviewed and noted.  Please refer to the obstetrical flow sheet for the fundal height and fetal heart rate documentation:  Patient reports good fetal movement, denies any bleeding and no rupture of membranes symptoms or regular contractions. Patient is without complaints. The pt reports this will be her last pregnancy. She has not considered birth control options. All questions were answered.  The pt reports a vaginal septum for which she had a Caesarian with her last pregnancy. Re the vaginal septum, repeat Caesarian at 39 weeks.  Pelvic exam: normal external genitalia, vulva, vagina, cervix, VULVA: normal appearing vulva with no masses, tenderness or lesions, VAGINA: vertical septum, otherwise normal appearing vagina with normal color and discharge, no lesions.  CERVIX: normal appearing cervix    Plan:  Continued routine obstetrical care, Repeat Caesarian at 39 weeks  Follow up in 2 weeks for OB appointment,

## 2013-10-26 NOTE — Progress Notes (Signed)
Pt denies any problems or concerns at this time.  

## 2013-11-09 ENCOUNTER — Ambulatory Visit (INDEPENDENT_AMBULATORY_CARE_PROVIDER_SITE_OTHER): Payer: Self-pay | Admitting: Obstetrics and Gynecology

## 2013-11-09 ENCOUNTER — Encounter: Payer: Self-pay | Admitting: Obstetrics and Gynecology

## 2013-11-09 VITALS — BP 118/72 | Wt 184.0 lb

## 2013-11-09 DIAGNOSIS — Q5210 Doubling of vagina, unspecified: Secondary | ICD-10-CM

## 2013-11-09 DIAGNOSIS — Z3480 Encounter for supervision of other normal pregnancy, unspecified trimester: Secondary | ICD-10-CM

## 2013-11-09 DIAGNOSIS — O3460 Maternal care for abnormality of vagina, unspecified trimester: Secondary | ICD-10-CM

## 2013-11-09 DIAGNOSIS — Z331 Pregnant state, incidental: Secondary | ICD-10-CM

## 2013-11-09 DIAGNOSIS — Z1389 Encounter for screening for other disorder: Secondary | ICD-10-CM

## 2013-11-09 DIAGNOSIS — Z3483 Encounter for supervision of other normal pregnancy, third trimester: Secondary | ICD-10-CM

## 2013-11-09 LAB — POCT URINALYSIS DIPSTICK
Blood, UA: NEGATIVE
Glucose, UA: NEGATIVE
KETONES UA: NEGATIVE
LEUKOCYTES UA: NEGATIVE
Nitrite, UA: NEGATIVE
PROTEIN UA: NEGATIVE

## 2013-11-09 NOTE — Progress Notes (Signed)
Pt denies any problems or concerns at this time.  

## 2013-11-09 NOTE — Progress Notes (Signed)
G2P1001 5836w1d Estimated Date of Delivery: 12/20/13  Blood pressure 118/72, weight 184 lb (83.462 kg), last menstrual period 03/15/2013.   BP weight and urine results all reviewed and noted.  Please refer to the obstetrical flow sheet for the fundal height and fetal heart rate documentation:  Patient reports good fetal movement, denies any bleeding and no rupture of membranes symptoms or regular contractions. Patient is without complaints. All questions were answered.  Plan:  Continued routine obstetrical care, repeat cesarean section at 39 weeks.   Follow up in 2 weeks for OB appointment, and scheduling of repeat cesarean.

## 2013-11-13 ENCOUNTER — Ambulatory Visit (INDEPENDENT_AMBULATORY_CARE_PROVIDER_SITE_OTHER): Payer: Self-pay | Admitting: Obstetrics & Gynecology

## 2013-11-13 VITALS — BP 118/50 | Wt 187.0 lb

## 2013-11-13 DIAGNOSIS — Z331 Pregnant state, incidental: Secondary | ICD-10-CM

## 2013-11-13 DIAGNOSIS — R259 Unspecified abnormal involuntary movements: Secondary | ICD-10-CM

## 2013-11-13 DIAGNOSIS — Z1389 Encounter for screening for other disorder: Secondary | ICD-10-CM

## 2013-11-13 DIAGNOSIS — R251 Tremor, unspecified: Secondary | ICD-10-CM

## 2013-11-13 NOTE — Progress Notes (Signed)
Ate recently Urine is concentrated  Bs 129 BP ok Now passed  G2P1001 2447w5d Estimated Date of Delivery: 12/20/13  Blood pressure 118/50, weight 187 lb (84.823 kg), last menstrual period 03/15/2013.   BP weight and urine results all reviewed and noted.  Please refer to the obstetrical flow sheet for the fundal height and fetal heart rate documentation:  Patient reports good fetal movement, denies any bleeding and no rupture of membranes symptoms or regular contractions. Patient is without complaints. All questions were answered.  Plan:  Continued routine obstetrical care,   Follow up in keep weeks for OB appointment, routine

## 2013-11-23 ENCOUNTER — Encounter: Payer: Self-pay | Admitting: Women's Health

## 2013-11-23 ENCOUNTER — Ambulatory Visit (INDEPENDENT_AMBULATORY_CARE_PROVIDER_SITE_OTHER): Payer: Self-pay | Admitting: Women's Health

## 2013-11-23 VITALS — BP 102/60 | Wt 185.0 lb

## 2013-11-23 DIAGNOSIS — Z3685 Encounter for antenatal screening for Streptococcus B: Secondary | ICD-10-CM

## 2013-11-23 DIAGNOSIS — Z1159 Encounter for screening for other viral diseases: Secondary | ICD-10-CM

## 2013-11-23 DIAGNOSIS — B009 Herpesviral infection, unspecified: Secondary | ICD-10-CM

## 2013-11-23 DIAGNOSIS — Z3480 Encounter for supervision of other normal pregnancy, unspecified trimester: Secondary | ICD-10-CM

## 2013-11-23 DIAGNOSIS — Z331 Pregnant state, incidental: Secondary | ICD-10-CM

## 2013-11-23 DIAGNOSIS — Z1389 Encounter for screening for other disorder: Secondary | ICD-10-CM

## 2013-11-23 DIAGNOSIS — Z3483 Encounter for supervision of other normal pregnancy, third trimester: Secondary | ICD-10-CM

## 2013-11-23 LAB — POCT URINALYSIS DIPSTICK
GLUCOSE UA: NEGATIVE
Ketones, UA: NEGATIVE
Leukocytes, UA: NEGATIVE
Nitrite, UA: NEGATIVE
RBC UA: NEGATIVE

## 2013-11-23 MED ORDER — ACYCLOVIR 400 MG PO TABS: 400.0000 mg | ORAL_TABLET | Freq: Three times a day (TID) | ORAL | Status: DC

## 2013-11-23 NOTE — Addendum Note (Signed)
Addended by: Gaylyn RongEVANS, Gareth Fitzner A on: 11/23/2013 10:12 AM   Modules accepted: Orders

## 2013-11-23 NOTE — Patient Instructions (Signed)
Call the office ([phone]) or go to [facility] if:  You begin to have strong, frequent contractions  Your water breaks.  Sometimes it is a big gush of fluid, sometimes it is just a trickle that keeps getting your panties wet or running down your legs  You have vaginal bleeding.  It is normal to have a small amount of spotting if your cervix was checked.   You don't feel your baby moving like normal.  If you don't, get you something to eat and drink and lay down and focus on feeling your baby move.  You should feel at least 10 movements in 2 hours.  If you don't, you should call the office or go to [facility].    Preterm Labor Information Preterm labor is when labor starts at less than 37 weeks of pregnancy. The normal length of a pregnancy is 39 to 41 weeks. CAUSES Often, there is no identifiable underlying cause as to why a woman goes into preterm labor. One of the most common known causes of preterm labor is infection. Infections of the uterus, cervix, vagina, amniotic sac, bladder, kidney, or even the lungs (pneumonia) can cause labor to start. Other suspected causes of preterm labor include:   Urogenital infections, such as yeast infections and bacterial vaginosis.   Uterine abnormalities (uterine shape, uterine septum, fibroids, or bleeding from the placenta).   A cervix that has been operated on (it may fail to stay closed).   Malformations in the fetus.   Multiple gestations (twins, triplets, and so on).   Breakage of the amniotic sac.  RISK FACTORS  Having a previous history of preterm labor.   Having premature rupture of membranes (PROM).   Having a placenta that covers the opening of the cervix (placenta previa).   Having a placenta that separates from the uterus (placental abruption).   Having a cervix that is too weak to hold the fetus in the uterus (incompetent cervix).   Having too much fluid in the amniotic sac (polyhydramnios).   Taking  illegal drugs or smoking while pregnant.   Not gaining enough weight while pregnant.   Being younger than 18 and older than 23 years old.   Having a low socioeconomic status.   Being African American. SYMPTOMS Signs and symptoms of preterm labor include:   Menstrual-like cramps, abdominal pain, or back pain.  Uterine contractions that are regular, as frequent as six in an hour, regardless of their intensity (may be mild or painful).  Contractions that start on the top of the uterus and spread down to the lower abdomen and back.   A sense of increased pelvic pressure.   A watery or bloody mucus discharge that comes from the vagina.  TREATMENT Depending on the length of the pregnancy and other circumstances, your health care provider may suggest bed rest. If necessary, there are medicines that can be given to stop contractions and to mature the fetal lungs. If labor happens before 34 weeks of pregnancy, a prolonged hospital stay may be recommended. Treatment depends on the condition of both you and the fetus.  WHAT SHOULD YOU DO IF YOU THINK YOU ARE IN PRETERM LABOR? Call your health care provider right away. You will need to go to the hospital to get checked immediately. HOW CAN YOU PREVENT PRETERM LABOR IN FUTURE PREGNANCIES? You should:   Stop smoking if you smoke.  Maintain healthy weight gain and avoid chemicals and drugs that are not necessary.  Be watchful for   any type of infection.  Inform your health care provider if you have a known history of preterm labor. Document Released: 06/23/2003 Document Revised: 12/03/2012 Document Reviewed: 05/05/2012 Exit[employer] Patient Information 2015 [employer]. This information is not intended to replace advice given to you by your health care provider. Make sure you discuss any questions you have with your health care provider.  

## 2013-11-23 NOTE — Progress Notes (Signed)
Low-risk OB appointment G2P1001 3441w1d Estimated Date of Delivery: 12/20/13 BP 102/60  Wt 185 lb (83.915 kg)  LMP 03/15/2013  BP, weight, and urine reviewed.  Refer to obstetrical flow sheet for FH & FHR.  Reports good fm.  Denies regular uc's, lof, vb, or uti s/s. No complaints. Desires RLTCS, hasn't been scheduled yet.  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 Plan:  Continue routine obstetrical care  F/U in 1wk for OB appointment  JVF given info to schedule RLTCS

## 2013-11-24 ENCOUNTER — Encounter: Payer: Self-pay | Admitting: Women's Health

## 2013-11-24 LAB — GC/CHLAMYDIA PROBE AMP
CT Probe RNA: NEGATIVE
GC PROBE AMP APTIMA: NEGATIVE

## 2013-11-24 LAB — STREP B DNA PROBE: GBSP: NOT DETECTED

## 2013-11-25 ENCOUNTER — Encounter: Payer: Self-pay | Admitting: Women's Health

## 2013-11-27 ENCOUNTER — Other Ambulatory Visit: Payer: Self-pay | Admitting: Obstetrics and Gynecology

## 2013-11-27 DIAGNOSIS — Z3493 Encounter for supervision of normal pregnancy, unspecified, third trimester: Secondary | ICD-10-CM

## 2013-11-30 ENCOUNTER — Other Ambulatory Visit: Payer: Self-pay | Admitting: Obstetrics and Gynecology

## 2013-11-30 ENCOUNTER — Ambulatory Visit (INDEPENDENT_AMBULATORY_CARE_PROVIDER_SITE_OTHER): Payer: Self-pay | Admitting: Obstetrics and Gynecology

## 2013-11-30 ENCOUNTER — Encounter: Payer: Self-pay | Admitting: Obstetrics and Gynecology

## 2013-11-30 VITALS — BP 120/68 | Wt 188.0 lb

## 2013-11-30 DIAGNOSIS — Z1389 Encounter for screening for other disorder: Secondary | ICD-10-CM

## 2013-11-30 DIAGNOSIS — Z3483 Encounter for supervision of other normal pregnancy, third trimester: Secondary | ICD-10-CM

## 2013-11-30 DIAGNOSIS — Z331 Pregnant state, incidental: Secondary | ICD-10-CM

## 2013-11-30 DIAGNOSIS — Z3480 Encounter for supervision of other normal pregnancy, unspecified trimester: Secondary | ICD-10-CM

## 2013-11-30 LAB — POCT URINALYSIS DIPSTICK
Blood, UA: NEGATIVE
GLUCOSE UA: NEGATIVE
Glucose, UA: NEGATIVE
Ketones, UA: NEGATIVE
Ketones, UA: NEGATIVE
Leukocytes, UA: NEGATIVE
Leukocytes, UA: NEGATIVE
NITRITE UA: NEGATIVE
NITRITE UA: NEGATIVE
RBC UA: NEGATIVE

## 2013-11-30 NOTE — Progress Notes (Signed)
This chart was scribed by Carl Bestelina Holson, Medical Scribe, for Dr. Christin BachJohn Feliz Lincoln on 11/30/2013 at 2:12 PM. This chart was reviewed by Dr. Christin BachJohn Shanedra Lave for accuracy.   G2P1001 3650w1d Estimated Date of Delivery: 12/20/13  Last menstrual period 03/15/2013.   Refer to the ob flow sheet for FH and FHR, also BP, Wt, Urine results:  Patient reports good fetal movement, denies any bleeding and no rupture of membranes symptoms or regular contractions.  Pt will have a C-section.  She plans to start birth control pills following this pregnancy.  Patient complaints: none.  Questions were answered. Plan:  Continued routine obstetrical care,   F/u in 1 wk.

## 2013-11-30 NOTE — Progress Notes (Signed)
Pt denies any problems or concerns at this time.  

## 2013-12-07 ENCOUNTER — Ambulatory Visit (INDEPENDENT_AMBULATORY_CARE_PROVIDER_SITE_OTHER): Payer: Self-pay | Admitting: Obstetrics and Gynecology

## 2013-12-07 ENCOUNTER — Encounter: Payer: Self-pay | Admitting: Obstetrics and Gynecology

## 2013-12-07 VITALS — BP 118/60 | Wt 188.8 lb

## 2013-12-07 DIAGNOSIS — Z331 Pregnant state, incidental: Secondary | ICD-10-CM

## 2013-12-07 DIAGNOSIS — Z1389 Encounter for screening for other disorder: Secondary | ICD-10-CM

## 2013-12-07 DIAGNOSIS — Z3483 Encounter for supervision of other normal pregnancy, third trimester: Secondary | ICD-10-CM

## 2013-12-07 DIAGNOSIS — Z3480 Encounter for supervision of other normal pregnancy, unspecified trimester: Secondary | ICD-10-CM

## 2013-12-07 LAB — POCT URINALYSIS DIPSTICK
Blood, UA: NEGATIVE
Glucose, UA: NEGATIVE
Ketones, UA: NEGATIVE
LEUKOCYTES UA: NEGATIVE
NITRITE UA: NEGATIVE

## 2013-12-07 NOTE — Progress Notes (Signed)
G2P1001 [redacted]w[redacted]d Estimated Date of Delivery: 12/20/13  Blood pressure 118/60, weight 188 lb 12.8 oz (85.639 kg), last menstrual period 03/15/2013.   refer to the ob flow sheet for FH and FHR, also BP, Wt, Urine results: notable for trace protein   Patient reports good fetal movement, denies any bleeding and no rupture of membranes symptoms or regular contractions. Patient complaints: None at this time.  Questions were answered. Patient is trying to contact financial services.  Plan:  Continued routine obstetrical care, patient requests RLTCS on 8/31  F/u in 2 weeks for post op visit

## 2013-12-10 ENCOUNTER — Encounter (HOSPITAL_COMMUNITY): Admission: RE | Payer: Self-pay | Source: Ambulatory Visit

## 2013-12-10 SURGERY — Surgical Case
Anesthesia: Regional

## 2013-12-11 ENCOUNTER — Encounter (HOSPITAL_COMMUNITY)
Admission: RE | Admit: 2013-12-11 | Discharge: 2013-12-11 | Disposition: A | Discharge status: Home / Self Care | Payer: Medicaid Other | Source: Ambulatory Visit | Attending: Obstetrics and Gynecology | Admitting: Obstetrics and Gynecology

## 2013-12-11 ENCOUNTER — Inpatient Hospital Stay (HOSPITAL_COMMUNITY): Admission: RE | Admit: 2013-12-11 | Payer: Medicaid Other | Source: Ambulatory Visit | Admitting: Family Medicine

## 2013-12-11 ENCOUNTER — Encounter (HOSPITAL_COMMUNITY): Payer: Self-pay

## 2013-12-11 VITALS — BP 105/57 | HR 61 | Temp 98.9°F | Resp 16 | Ht 61.0 in | Wt 192.0 lb

## 2013-12-11 DIAGNOSIS — Z3483 Encounter for supervision of other normal pregnancy, third trimester: Secondary | ICD-10-CM

## 2013-12-11 DIAGNOSIS — Z98891 History of uterine scar from previous surgery: Secondary | ICD-10-CM

## 2013-12-11 DIAGNOSIS — Z9889 Other specified postprocedural states: Secondary | ICD-10-CM | POA: Insufficient documentation

## 2013-12-11 DIAGNOSIS — Z01818 Encounter for other preprocedural examination: Secondary | ICD-10-CM | POA: Insufficient documentation

## 2013-12-11 DIAGNOSIS — Z3480 Encounter for supervision of other normal pregnancy, unspecified trimester: Secondary | ICD-10-CM | POA: Insufficient documentation

## 2013-12-11 LAB — CBC
HCT: 34.7 % — ABNORMAL LOW (ref 36.0–46.0)
Hemoglobin: 11.5 g/dL — ABNORMAL LOW (ref 12.0–15.0)
MCH: 27.8 pg (ref 26.0–34.0)
MCHC: 33.1 g/dL (ref 30.0–36.0)
MCV: 83.8 fL (ref 78.0–100.0)
Platelets: 143 K/uL — ABNORMAL LOW (ref 150–400)
RBC: 4.14 MIL/uL (ref 3.87–5.11)
RDW: 13.8 % (ref 11.5–15.5)
WBC: 6.8 K/uL (ref 4.0–10.5)

## 2013-12-11 LAB — URINALYSIS, ROUTINE W REFLEX MICROSCOPIC
Bilirubin Urine: NEGATIVE
Glucose, UA: NEGATIVE mg/dL
Hgb urine dipstick: NEGATIVE
Ketones, ur: NEGATIVE mg/dL
Nitrite: NEGATIVE
Protein, ur: NEGATIVE mg/dL
Specific Gravity, Urine: 1.02 (ref 1.005–1.030)
Urobilinogen, UA: 0.2 mg/dL (ref 0.0–1.0)
pH: 6 (ref 5.0–8.0)

## 2013-12-11 LAB — URINE MICROSCOPIC-ADD ON

## 2013-12-11 LAB — ABO/RH: ABO/RH(D): O POS

## 2013-12-11 LAB — SYPHILIS: RPR W/REFLEX TO RPR TITER AND TREPONEMAL ANTIBODIES, TRADITIONAL SCREENING AND DIAGNOSIS ALGORITHM

## 2013-12-11 NOTE — Patient Instructions (Signed)
Your procedure is scheduled on:12/14/13  Enter through the Main Entrance at :1:30 pm Pick up desk phone and dial [phone] and inform us of your arrival.  Please call [phone] if you have any problems the morning of surgery.  Remember: Do not eat food after midnight:except toast ok until 8am on Monday Clear liquids are ok until:11am on Monday   You may brush your teeth the morning of surgery.  DO NOT wear jewelry, eye make-up, lipstick,body lotion, or dark fingernail polish.  (Polished toes are ok) You may wear deodorant.  If you are to be admitted after surgery, leave suitcase in car until your room has been assigned. Patients discharged on the day of surgery will not be allowed to drive home. Wear loose fitting, comfortable clothes for your ride home.

## 2013-12-13 ENCOUNTER — Encounter (HOSPITAL_COMMUNITY)
Admission: AD | Disposition: A | Discharge status: Home / Self Care | Payer: Self-pay | Source: Ambulatory Visit | Attending: Obstetrics & Gynecology

## 2013-12-13 ENCOUNTER — Inpatient Hospital Stay (HOSPITAL_COMMUNITY)
Admission: AD | Admit: 2013-12-13 | Discharge: 2013-12-15 | DRG: 775 | Disposition: A | Discharge status: Home / Self Care | Payer: Medicaid Other | Source: Ambulatory Visit | Attending: Obstetrics & Gynecology | Admitting: Obstetrics & Gynecology

## 2013-12-13 ENCOUNTER — Inpatient Hospital Stay (HOSPITAL_COMMUNITY): Payer: Medicaid Other | Admitting: Anesthesiology

## 2013-12-13 ENCOUNTER — Encounter (HOSPITAL_COMMUNITY): Payer: Medicaid Other | Admitting: Anesthesiology

## 2013-12-13 ENCOUNTER — Encounter (HOSPITAL_COMMUNITY): Payer: Self-pay

## 2013-12-13 DIAGNOSIS — Z98891 History of uterine scar from previous surgery: Secondary | ICD-10-CM

## 2013-12-13 DIAGNOSIS — O3460 Maternal care for abnormality of vagina, unspecified trimester: Secondary | ICD-10-CM | POA: Diagnosis present

## 2013-12-13 DIAGNOSIS — O9903 Anemia complicating the puerperium: Secondary | ICD-10-CM | POA: Diagnosis present

## 2013-12-13 DIAGNOSIS — Z3483 Encounter for supervision of other normal pregnancy, third trimester: Secondary | ICD-10-CM

## 2013-12-13 DIAGNOSIS — O34219 Maternal care for unspecified type scar from previous cesarean delivery: Secondary | ICD-10-CM | POA: Diagnosis present

## 2013-12-13 DIAGNOSIS — Q5211 Transverse vaginal septum: Secondary | ICD-10-CM

## 2013-12-13 DIAGNOSIS — O479 False labor, unspecified: Secondary | ICD-10-CM | POA: Diagnosis present

## 2013-12-13 DIAGNOSIS — D649 Anemia, unspecified: Secondary | ICD-10-CM | POA: Diagnosis present

## 2013-12-13 DIAGNOSIS — Z833 Family history of diabetes mellitus: Secondary | ICD-10-CM

## 2013-12-13 DIAGNOSIS — Z3490 Encounter for supervision of normal pregnancy, unspecified, unspecified trimester: Secondary | ICD-10-CM

## 2013-12-13 LAB — CBC
HEMATOCRIT: 35.9 % — AB (ref 36.0–46.0)
HEMOGLOBIN: 12.2 g/dL (ref 12.0–15.0)
MCH: 28.3 pg (ref 26.0–34.0)
MCHC: 34 g/dL (ref 30.0–36.0)
MCV: 83.3 fL (ref 78.0–100.0)
Platelets: 161 10*3/uL (ref 150–400)
RBC: 4.31 MIL/uL (ref 3.87–5.11)
RDW: 13.8 % (ref 11.5–15.5)
WBC: 12.1 10*3/uL — AB (ref 4.0–10.5)

## 2013-12-13 LAB — TYPE AND SCREEN
ABO/RH(D): O POS
ABO/RH(D): O POS
Antibody Screen: NEGATIVE
Antibody Screen: NEGATIVE

## 2013-12-13 LAB — RPR

## 2013-12-13 SURGERY — Surgical Case
Anesthesia: Spinal

## 2013-12-13 MED ORDER — FENTANYL CITRATE 0.05 MG/ML IJ SOLN
100.0000 ug | Freq: Once | INTRAMUSCULAR | Status: AC
  Administered 2013-12-13: 100 ug via INTRAVENOUS
  Filled 2013-12-13: qty 2

## 2013-12-13 MED ORDER — BENZOCAINE-MENTHOL 20-0.5 % EX AERO
1.0000 "application " | INHALATION_SPRAY | CUTANEOUS | Status: DC | PRN
  Filled 2013-12-13: qty 56

## 2013-12-13 MED ORDER — SIMETHICONE 80 MG PO CHEW: 80.0000 mg | CHEWABLE_TABLET | ORAL | Status: DC | PRN

## 2013-12-13 MED ORDER — CITRIC ACID-SODIUM CITRATE 334-500 MG/5ML PO SOLN: 30.0000 mL | Freq: Once | ORAL | Status: DC

## 2013-12-13 MED ORDER — LACTATED RINGERS IV SOLN: INTRAVENOUS | Status: DC

## 2013-12-13 MED ORDER — PHENYLEPHRINE 40 MCG/ML (10ML) SYRINGE FOR IV PUSH (FOR BLOOD PRESSURE SUPPORT)
80.0000 ug | PREFILLED_SYRINGE | INTRAVENOUS | Status: DC | PRN
  Filled 2013-12-13: qty 2

## 2013-12-13 MED ORDER — EPHEDRINE 5 MG/ML INJ
10.0000 mg | INTRAVENOUS | Status: DC | PRN
  Filled 2013-12-13: qty 2

## 2013-12-13 MED ORDER — LACTATED RINGERS IV SOLN: 500.0000 mL | Freq: Once | INTRAVENOUS | Status: DC

## 2013-12-13 MED ORDER — CITRIC ACID-SODIUM CITRATE 334-500 MG/5ML PO SOLN: 30.0000 mL | ORAL | Status: DC | PRN

## 2013-12-13 MED ORDER — DIBUCAINE 1 % RE OINT: 1.0000 "application " | TOPICAL_OINTMENT | RECTAL | Status: DC | PRN

## 2013-12-13 MED ORDER — TETANUS-DIPHTH-ACELL PERTUSSIS 5-2.5-18.5 LF-MCG/0.5 IM SUSP
0.5000 mL | Freq: Once | INTRAMUSCULAR | Status: AC
  Administered 2013-12-14: 0.5 mL via INTRAMUSCULAR
  Filled 2013-12-13: qty 0.5

## 2013-12-13 MED ORDER — OXYTOCIN BOLUS FROM INFUSION
500.0000 mL | INTRAVENOUS | Status: DC
  Administered 2013-12-13: 500 mL via INTRAVENOUS

## 2013-12-13 MED ORDER — CEFAZOLIN SODIUM-DEXTROSE 2-3 GM-% IV SOLR: 2.0000 g | Freq: Once | INTRAVENOUS | Status: DC

## 2013-12-13 MED ORDER — OXYTOCIN 40 UNITS IN LACTATED RINGERS INFUSION - SIMPLE MED
INTRAVENOUS | Status: AC
  Filled 2013-12-13: qty 1000

## 2013-12-13 MED ORDER — LACTATED RINGERS IV SOLN: Freq: Once | INTRAVENOUS | Status: DC

## 2013-12-13 MED ORDER — ZOLPIDEM TARTRATE 5 MG PO TABS: 5.0000 mg | ORAL_TABLET | Freq: Every evening | ORAL | Status: DC | PRN

## 2013-12-13 MED ORDER — FAMOTIDINE IN NACL 20-0.9 MG/50ML-% IV SOLN: 20.0000 mg | Freq: Once | INTRAVENOUS | Status: DC

## 2013-12-13 MED ORDER — LIDOCAINE HCL (PF) 1 % IJ SOLN
INTRAMUSCULAR | Status: AC
  Administered 2013-12-13: 30 mL via SUBCUTANEOUS
  Filled 2013-12-13: qty 30

## 2013-12-13 MED ORDER — DIPHENHYDRAMINE HCL 25 MG PO CAPS: 25.0000 mg | ORAL_CAPSULE | Freq: Four times a day (QID) | ORAL | Status: DC | PRN

## 2013-12-13 MED ORDER — CEFAZOLIN SODIUM-DEXTROSE 2-3 GM-% IV SOLR: 2.0000 g | INTRAVENOUS | Status: DC

## 2013-12-13 MED ORDER — OXYCODONE-ACETAMINOPHEN 5-325 MG PO TABS: 1.0000 | ORAL_TABLET | ORAL | Status: DC | PRN

## 2013-12-13 MED ORDER — SODIUM CHLORIDE 0.9 % IV SOLN
14.0000 mL/h | INTRAVENOUS | Status: DC | PRN
  Filled 2013-12-13 (×2): qty 17

## 2013-12-13 MED ORDER — LANOLIN HYDROUS EX OINT: TOPICAL_OINTMENT | CUTANEOUS | Status: DC | PRN

## 2013-12-13 MED ORDER — DIPHENHYDRAMINE HCL 50 MG/ML IJ SOLN: 12.5000 mg | INTRAMUSCULAR | Status: DC | PRN

## 2013-12-13 MED ORDER — PRENATAL MULTIVITAMIN CH
1.0000 | ORAL_TABLET | Freq: Every day | ORAL | Status: DC
  Administered 2013-12-14 – 2013-12-15 (×2): 1 via ORAL
  Filled 2013-12-13 (×2): qty 1

## 2013-12-13 MED ORDER — ACETAMINOPHEN 325 MG PO TABS
650.0000 mg | ORAL_TABLET | ORAL | Status: DC | PRN
Start: 2013-12-13 — End: 2013-12-13

## 2013-12-13 MED ORDER — ONDANSETRON HCL 4 MG/2ML IJ SOLN: 4.0000 mg | INTRAMUSCULAR | Status: DC | PRN

## 2013-12-13 MED ORDER — OXYTOCIN 40 UNITS IN LACTATED RINGERS INFUSION - SIMPLE MED: 62.5000 mL/h | INTRAVENOUS | Status: DC

## 2013-12-13 MED ORDER — IBUPROFEN 600 MG PO TABS
600.0000 mg | ORAL_TABLET | Freq: Four times a day (QID) | ORAL | Status: DC | PRN
Start: 2013-12-13 — End: 2013-12-13

## 2013-12-13 MED ORDER — SENNOSIDES-DOCUSATE SODIUM 8.6-50 MG PO TABS
2.0000 | ORAL_TABLET | ORAL | Status: DC
  Administered 2013-12-14 – 2013-12-15 (×2): 2 via ORAL
  Filled 2013-12-13 (×2): qty 2

## 2013-12-13 MED ORDER — LACTATED RINGERS IV SOLN: 500.0000 mL | INTRAVENOUS | Status: DC | PRN

## 2013-12-13 MED ORDER — IBUPROFEN 600 MG PO TABS
600.0000 mg | ORAL_TABLET | Freq: Four times a day (QID) | ORAL | Status: DC
  Administered 2013-12-13 – 2013-12-15 (×8): 600 mg via ORAL
  Filled 2013-12-13 (×8): qty 1

## 2013-12-13 MED ORDER — ONDANSETRON HCL 4 MG PO TABS: 4.0000 mg | ORAL_TABLET | ORAL | Status: DC | PRN

## 2013-12-13 MED ORDER — WITCH HAZEL-GLYCERIN EX PADS: 1.0000 "application " | MEDICATED_PAD | CUTANEOUS | Status: DC | PRN

## 2013-12-13 MED ORDER — SCOPOLAMINE 1 MG/3DAYS TD PT72
1.0000 | MEDICATED_PATCH | Freq: Once | TRANSDERMAL | Status: DC
  Filled 2013-12-13: qty 1

## 2013-12-13 MED ORDER — LIDOCAINE HCL (PF) 1 % IJ SOLN
30.0000 mL | INTRAMUSCULAR | Status: AC | PRN
  Administered 2013-12-13: 30 mL via SUBCUTANEOUS
  Filled 2013-12-13: qty 30

## 2013-12-13 MED ORDER — ONDANSETRON HCL 4 MG/2ML IJ SOLN: 4.0000 mg | Freq: Four times a day (QID) | INTRAMUSCULAR | Status: DC | PRN

## 2013-12-13 MED ORDER — FLEET ENEMA 7-19 GM/118ML RE ENEM: 1.0000 | ENEMA | RECTAL | Status: DC | PRN

## 2013-12-13 SURGICAL SUPPLY — 27 items
BARRIER ADHS 3X4 INTERCEED (GAUZE/BANDAGES/DRESSINGS) IMPLANT
BLADE SURG 10 STRL SS (BLADE) IMPLANT
CLAMP CORD UMBIL (MISCELLANEOUS) IMPLANT
CLOTH BEACON ORANGE TIMEOUT ST (SAFETY) IMPLANT
DRAPE LG THREE QUARTER DISP (DRAPES) IMPLANT
DRSG OPSITE POSTOP 4X10 (GAUZE/BANDAGES/DRESSINGS) IMPLANT
DURAPREP 26ML APPLICATOR (WOUND CARE) IMPLANT
ELECT REM PT RETURN 9FT ADLT (ELECTROSURGICAL)
ELECTRODE REM PT RTRN 9FT ADLT (ELECTROSURGICAL) IMPLANT
EXTRACTOR VACUUM KIWI (MISCELLANEOUS) IMPLANT
GLOVE BIO SURGEON STRL SZ 6.5 (GLOVE) IMPLANT
GLOVE BIO SURGEONS STRL SZ 6.5 (GLOVE)
GLOVE BIOGEL PI IND STRL 7.0 (GLOVE) IMPLANT
GLOVE BIOGEL PI INDICATOR 7.0 (GLOVE)
GOWN STRL REUS W/TWL LRG LVL3 (GOWN DISPOSABLE) IMPLANT
KIT ABG SYR 3ML LUER SLIP (SYRINGE) IMPLANT
NEEDLE HYPO 25X5/8 SAFETYGLIDE (NEEDLE) IMPLANT
NS IRRIG 1000ML POUR BTL (IV SOLUTION) IMPLANT
PACK C SECTION WH (CUSTOM PROCEDURE TRAY) IMPLANT
PAD OB MATERNITY 4.3X12.25 (PERSONAL CARE ITEMS) IMPLANT
SUT VIC AB 0 CT1 36 (SUTURE) IMPLANT
SUT VIC AB 2-0 CT1 27 (SUTURE)
SUT VIC AB 2-0 CT1 TAPERPNT 27 (SUTURE) IMPLANT
SUT VIC AB 4-0 PS2 27 (SUTURE) IMPLANT
TOWEL OR 17X24 6PK STRL BLUE (TOWEL DISPOSABLE) IMPLANT
TRAY FOLEY CATH 14FR (SET/KITS/TRAYS/PACK) IMPLANT
WATER STERILE IRR 1000ML POUR (IV SOLUTION) IMPLANT

## 2013-12-13 NOTE — Progress Notes (Signed)
Order for fentanyl iv x1

## 2013-12-13 NOTE — H&P (Signed)
Brandy Kaufman is a 23 y.o. female G2P1001  pt of FT presenting for labor evaluation.  She has hx of C/S x1 related to vaginal septum and has planned RLTCS scheduled 8/31.  Upon arrival in MAU, she was breathing with contractions and found to be 4-5 cm with bulging bag of water.  Orders were placed to prepare pt for OR.  Pt quickly progressed to 9 cm and Dr Debroah Loop came to bedside to discuss options with pt.  Pt agrees to VBAC since she is in advanced labor and consent signed and witnessed.  She reports good fetal movement, denies LOF, vaginal bleeding, vaginal itching/burning, urinary symptoms, h/a, dizziness, n/v, or fever/chills.     [facility]  FOB Alyce Pagan, [place], Hispanic, 5th baby  Pap 06/01/13: ASC-H w/ +HRHPV   Colpo:normal  GC/CT Initial:      -/-          36+wks:  -/-  Genetic Screen NT/IT: normal  CF screen declined  Anatomic [place] Normal female 'Brandy Kaufman'  Flu vaccine Recommended, declined 06/01/13  Tdap Recommended ~ 28wks  Glucose Screen  2 hr  GBS neg  Feed Preference bottle  Contraception undecided  Circumcision n/a  Childbirth Classes declined  Pediatrician TMPA    Maternal Medical History:  Reason for admission: Contractions.  Nausea.  Contractions: Onset was 1-2 hours ago.   Frequency: regular.   Duration is approximately 1 minute.   Perceived severity is strong.    Fetal activity: Perceived fetal activity is normal.   Last perceived fetal movement was within the past hour.    Prenatal complications: no prenatal complications Prenatal Complications - Diabetes: none.    OB History   Grav Para Term Preterm Abortions TAB SAB Ect Mult Living   Past Medical History  Diagnosis Date  . Medical history non-contributory    Past Surgical History  Procedure Laterality Date  . Cesarean section     Family History: family history includes Diabetes in her father. Social History:  reports that she has never smoked. She  has never used smokeless tobacco. She reports that she does not drink alcohol or use illicit drugs.   Prenatal Transfer Tool  Maternal Diabetes: No Genetic Screening: Normal Maternal Ultrasounds/Referrals: Normal Fetal Ultrasounds or other Referrals:  None Maternal Substance Abuse:  No Significant Maternal Medications:  None Significant Maternal Lab Results:  Lab values include: Group B Strep negative Other Comments:  None  Review of Systems  Constitutional: Negative for fever, chills and malaise/fatigue.  Eyes: Negative for blurred vision.  Respiratory: Negative for cough and shortness of breath.   Cardiovascular: Negative for chest pain.  Gastrointestinal: Positive for abdominal pain. Negative for heartburn, nausea and vomiting.  Genitourinary: Negative for dysuria, urgency and frequency.  Musculoskeletal: Negative.   Neurological: Negative for dizziness and headaches.  Psychiatric/Behavioral: Negative for depression.    Dilation: Lip/rim Effacement (%): 100 Exam by:: Leftwich-Kirby Blood pressure 122/51, pulse 75, temperature 98 F (36.7 C), temperature source Oral, resp. rate 20, last menstrual period 03/15/2013. Maternal Exam:  Uterine Assessment: Contraction strength is firm.  Contraction duration is 70 seconds. Contraction frequency is regular.   Abdomen: Patient reports no abdominal tenderness. Fetal presentation: vertex  Cervix: Cervix evaluated by digital exam.     Fetal Exam Fetal Monitor Review: Mode: ultrasound.   Baseline rate: 135.  Variability: moderate (6-25 bpm).   Pattern: accelerations present and no decelerations.  Fetal State Assessment: Category I - tracings are normal.     Physical Exam  Nursing note and vitals reviewed. Constitutional: She is oriented to person, place, and time. She appears well-developed and well-nourished.  Neck: Normal range of motion.  Cardiovascular: Normal rate and regular rhythm.   Respiratory: Effort normal and  breath sounds normal.  GI: Soft.  Musculoskeletal: Normal range of motion.  Neurological: She is alert and oriented to person, place, and time.  Skin: Skin is warm and dry.  Psychiatric: She has a normal mood and affect. Her behavior is normal. Judgment and thought content normal.    Prenatal labs: ABO, Rh: --/--/O POS (08/28 1010) Antibody: NEG (08/28 1008) Rubella: 2.58 (02/16 1053) RPR: NON REAC (08/28 1010)  HBsAg: NEGATIVE (02/16 1053)  HIV: NONREACTIVE (06/15 0949)  GBS: NOT DETECTED (08/10 1239)   Assessment/Plan: Active labor at term Hx LTCS for vaginal septum Consent for TOLAC on admission today  Admit to [facility] Expectant management Anticipate VBAC   LEFTWICH-KIRBY, Alvis Edgell 12/13/2013, 4:42 PM

## 2013-12-13 NOTE — MAU Note (Signed)
Pt for repeat c/s tomorrow. Here for contractions for past hour or two. Denies bleeding or lof.

## 2013-12-13 NOTE — Progress Notes (Signed)
Notified pt pushing

## 2013-12-13 NOTE — MAU Note (Signed)
Pt presents to MAU with complaints of contractions that started today around 3. Denies any vaginal bleedingor LOF

## 2013-12-13 NOTE — Progress Notes (Signed)
Notified of pt vaginal exam. Received orders to admit and prepare for OR

## 2013-12-13 NOTE — Anesthesia Preprocedure Evaluation (Deleted)
Anesthesia Evaluation  Patient identified by MRN, date of birth, ID band Patient awake    Reviewed: Allergy & Precautions, H&P , NPO status , Patient's Chart, lab work & pertinent test results  Airway Mallampati: II TM Distance: >3 FB Neck ROM: Full    Dental no notable dental hx.    Pulmonary neg pulmonary ROS,  breath sounds clear to auscultation  Pulmonary exam normal       Cardiovascular negative cardio ROS  Rhythm:Regular Rate:Normal     Neuro/Psych negative neurological ROS  negative psych ROS   GI/Hepatic negative GI ROS, Neg liver ROS,   Endo/Other  negative endocrine ROS  Renal/GU negative Renal ROS     Musculoskeletal negative musculoskeletal ROS (+)   Abdominal   Peds  Hematology negative hematology ROS (+)   Anesthesia Other Findings   Reproductive/Obstetrics (+) Pregnancy                           Anesthesia Physical Anesthesia Plan  ASA: II and emergent  Anesthesia Plan: Spinal   Post-op Pain Management:    Induction:   Airway Management Planned:   Additional Equipment:   Intra-op Plan:   Post-operative Plan:   Informed Consent: I have reviewed the patients History and Physical, chart, labs and discussed the procedure including the risks, benefits and alternatives for the proposed anesthesia with the patient or authorized representative who has indicated his/her understanding and acceptance.     Plan Discussed with: CRNA  Anesthesia Plan Comments:         Anesthesia Quick Evaluation

## 2013-12-13 NOTE — Lactation Note (Signed)
This note was copied from the chart of [facility]. Lactation Consultation Note  Patient Name: Brandy Kaufman [id number] Date: 12/13/2013 Reason for consult: Other (Comment) (charting for exclusion)   Maternal Data Formula Feeding for Exclusion: Yes Reason for exclusion: Mother's choice to formula feed on admision  Feeding Feeding Type: Bottle Fed - Formula Nipple Type: Regular  LATCH Score/Interventions                      Lactation Tools Discussed/Used     Consult Status Consult Status: Complete    Lynda Rainwater 12/13/2013, 9:17 PM

## 2013-12-14 ENCOUNTER — Inpatient Hospital Stay (HOSPITAL_COMMUNITY): Admission: RE | Admit: 2013-12-14 | Payer: Self-pay | Source: Ambulatory Visit | Admitting: Obstetrics and Gynecology

## 2013-12-14 ENCOUNTER — Encounter (HOSPITAL_COMMUNITY): Admission: RE | Payer: Self-pay | Source: Ambulatory Visit

## 2013-12-14 LAB — CBC
HCT: 28.4 % — ABNORMAL LOW (ref 36.0–46.0)
Hemoglobin: 9.5 g/dL — ABNORMAL LOW (ref 12.0–15.0)
MCH: 28 pg (ref 26.0–34.0)
MCHC: 33.5 g/dL (ref 30.0–36.0)
MCV: 83.8 fL (ref 78.0–100.0)
PLATELETS: 137 10*3/uL — AB (ref 150–400)
RBC: 3.39 MIL/uL — ABNORMAL LOW (ref 3.87–5.11)
RDW: 13.9 % (ref 11.5–15.5)
WBC: 10.7 10*3/uL — ABNORMAL HIGH (ref 4.0–10.5)

## 2013-12-14 SURGERY — Surgical Case
Anesthesia: Regional

## 2013-12-14 MED ORDER — FERROUS SULFATE 325 (65 FE) MG PO TABS: 325.0000 mg | ORAL_TABLET | Freq: Two times a day (BID) | ORAL | Status: DC

## 2013-12-14 NOTE — Plan of Care (Signed)
Problem: Phase II Progression Outcomes Goal: Progress activity as tolerated unless otherwise ordered Outcome: Completed/Met Date Met:  12/14/13 Independently showered

## 2013-12-14 NOTE — Progress Notes (Signed)
Post Partum Day 1; Post VBAC and vaginal septum tear Subjective: up ad lib, voiding, tolerating PO and bottlefeeding.  Pt denies SOB, dizziness, chest pain or palpitations.  Pain controlled with ibuprofen.  Uncertain regarding family planning.  Desires discharge in AM.    Objective: Blood pressure 98/45, pulse 59, temperature 98.2 F (36.8 C), temperature source Oral, resp. rate 19, height  (1.549 m), weight 87.091 kg (192 lb), last menstrual period 03/15/2013, SpO2 100.00%, unknown if currently breastfeeding.  Physical Exam:  General: alert, cooperative and appears stated age CVS:  RRR, without murmur, gallops, or rubs Lungs:  CTA bilat ABD:  +BSx4, normal Lochia: appropriate Uterine Fundus: firm DVT Evaluation: No evidence of DVT seen on physical exam. Negative Homan's sign.   Recent Labs  12/13/13 1557 12/14/13 0550  HGB 12.2 9.5*  HCT 35.9* 28.4*    Assessment/Plan: Anemia - Stable Successful VBAC  Plan for discharge tomorrow and Contraception Uncertain.   LOS: 1 day   Marlis Edelson 12/14/2013, 7:23 AM

## 2013-12-14 NOTE — Progress Notes (Signed)
UR chart review completed.  

## 2013-12-15 MED ORDER — IBUPROFEN 600 MG PO TABS: 600.0000 mg | ORAL_TABLET | Freq: Four times a day (QID) | ORAL | Status: DC

## 2013-12-15 MED ORDER — FERROUS SULFATE 325 (65 FE) MG PO TABS: 325.0000 mg | ORAL_TABLET | Freq: Two times a day (BID) | ORAL | Status: DC

## 2013-12-15 MED ORDER — FENTANYL CITRATE 0.05 MG/ML IJ SOLN: 100.0000 ug | INTRAMUSCULAR | Status: DC | PRN

## 2013-12-15 NOTE — Discharge Summary (Signed)
Obstetric Discharge Summary Reason for Admission: onset of labor Prenatal Procedures: NST Intrapartum Procedures: spontaneous vaginal delivery Postpartum Procedures: repair of vaginal septum Complications-Operative and Postpartum: vaginal septum tear Hemoglobin  Date Value Ref Range Status  12/14/2013 9.5* 12.0 - 15.0 g/dL Final     DELTA CHECK NOTED     REPEATED TO VERIFY     HCT  Date Value Ref Range Status  12/14/2013 28.4* 36.0 - 46.0 % Final    Physical Exam:  General: alert, cooperative and no distress Lochia: appropriate Uterine Fundus: firm Incision: None DVT Evaluation: No evidence of DVT seen on physical exam. No significant calf/ankle edema.  Discharge Diagnoses: Term Pregnancy-delivered  Discharge Information: Date: 12/15/2013 Activity: pelvic rest Diet: routine Medications: PNV, Ibuprofen, Colace and Iron Condition: stable Instructions: refer to practice specific booklet Discharge to: home   Newborn Data: Live born female  Birth Weight: 6 lb 14.8 oz (3141 g) APGAR: 8, 9  Home with mother.  Brandy Kaufman 12/15/2013, 7:53 AM

## 2013-12-23 ENCOUNTER — Ambulatory Visit: Payer: Self-pay | Admitting: Obstetrics and Gynecology

## 2013-12-23 NOTE — Progress Notes (Signed)
Rescheduled. Was given postop appointment for C-Section, but delivered vaginally. Had vaginal septum disrupted by vaginal delivery.  No complaints s/p the vaginal septum disruption and delievered. Exam rescheduled for 4 weeks.

## 2014-01-27 ENCOUNTER — Encounter: Payer: Self-pay | Admitting: Women's Health

## 2014-01-27 ENCOUNTER — Ambulatory Visit (INDEPENDENT_AMBULATORY_CARE_PROVIDER_SITE_OTHER): Payer: Self-pay | Admitting: Women's Health

## 2014-01-27 DIAGNOSIS — O99345 Other mental disorders complicating the puerperium: Secondary | ICD-10-CM

## 2014-01-27 DIAGNOSIS — F53 Postpartum depression: Secondary | ICD-10-CM | POA: Insufficient documentation

## 2014-01-27 NOTE — Progress Notes (Signed)
Patient ID: Brandy Kaufman, female   DOB: Sep 04, 1990, 23 y.o.   MRN: [id number] Subjective:    Brandy Kaufman is a 23 y.o. 222P2002 Hispanic female who presents for a postpartum visit. She is 6 weeks postpartum following a vaginal birth after cesarean (VBAC) at 6039 gestational weeks. Vaginal septum tore at delivery, was ligated by Dr. Debroah LoopArnold during repair of 2nd degree lac. Anesthesia: pudendal block for repair . I have fully reviewed the prenatal and intrapartum course. Postpartum course has been uncomplicated. Baby's course has been uncomplicated. Baby is feeding by bottle. Bleeding no bleeding. Bowel function is normal. Bladder function is normal. Patient is sexually active. Last sexual activity: 10/9. Contraception method is none and is unsure whether she wants depo or nexplanon- discussed r/b of both and that both can potentially worsen depression. Postpartum depression screening: positive. Score 12.  Still finds joy in things she used to find joy in, eating well, sleeping ok, does cry a lot, has support at home, able to talk to mom when she feels down and that helps a lot. Denies SI/HI/II.  Last pap 06/01/13 and was ASC-H w/ +HRHPV, normal colpo, needs repeat pap.  The following portions of the patient's history were reviewed and updated as appropriate: allergies, current medications, past medical history, past surgical history and problem list.  Review of Systems Pertinent items are noted in HPI.   Filed Vitals:   01/27/14 1007  BP: 102/64  Height: 5\' 1"  (1.549 m)  Weight: 178 lb (80.74 kg)   No LMP recorded.  Objective:   General:  alert, cooperative and no distress   Breasts:  deferred, no complaints  Lungs: clear to auscultation bilaterally  Heart:  regular rate and rhythm  Abdomen: soft, nontender   Vulva: normal  Vagina: normal vagina  Cervix:  closed  Corpus: Well-involuted  Adnexa:  Non-palpable  Rectal Exam: No hemorrhoids        Assessment:    Postpartum exam 6 wks s/p VBAC Bottlefeeding Depression screening PPD Abnormal pap w/ normal colpo during pregnancy Contraception counseling   Plan:   Contraception: abstinence until she decides which contraception, to call and let us know, if nexplanon to come in and have ordered. No sex x at least 10d prior to coming for contraception and will do bhcg in am and contraception pm.  To let us know if depression is worsening or developing SI/HI/II Follow up in: 2 weeks for pap & physical or earlier if needed  Marge DuncansBooker, Kimberly Randall CNM, [facility]NP-BC 01/27/2014 10:17 AM

## 2014-01-27 NOTE — Patient Instructions (Addendum)
Call when you decide what you want to do for birth control. If you want Nexplanon, you will have to come in to sign paper to have it ordered, it will take about 3 weeks to come in. Whatever method you decide, do not have sex for at least 10 days before you come in to get the birth control and we will do a blood test that morning to make sure you are not pregnant, then do the birth control that afternoon.   Postpartum Depression and Baby Blues The postpartum period begins right after the birth of a baby. During this time, there is often a great amount of joy and excitement. It is also a time of many changes in the life of the parents. Regardless of how many times a mother gives birth, each child brings new challenges and dynamics to the family. It is not unusual to have feelings of excitement along with confusing shifts in moods, emotions, and thoughts. All mothers are at risk of developing postpartum depression or the "baby blues." These mood changes can occur right after giving birth, or they may occur many months after giving birth. The baby blues or postpartum depression can be mild or severe. Additionally, postpartum depression can go away rather quickly, or it can be a long-term condition.  CAUSES Raised hormone levels and the rapid drop in those levels are thought to be a main cause of postpartum depression and the baby blues. A number of hormones change during and after pregnancy. Estrogen and progesterone usually decrease right after the delivery of your baby. The levels of thyroid hormone and various cortisol steroids also rapidly drop. Other factors that play a role in these mood changes include major life events and genetics.  RISK FACTORS If you have any of the following risks for the baby blues or postpartum depression, know what symptoms to watch out for during the postpartum period. Risk factors that may increase the likelihood of getting the baby blues or postpartum depression  include:  Having a personal or family history of depression.   Having depression while being pregnant.   Having premenstrual mood issues or mood issues related to oral contraceptives.  Having a lot of life stress.   Having marital conflict.   Lacking a social support network.   Having a baby with special needs.   Having health problems, such as diabetes.  SIGNS AND SYMPTOMS Symptoms of baby blues include:  Brief changes in mood, such as going from extreme happiness to sadness.  Decreased concentration.   Difficulty sleeping.   Crying spells, tearfulness.   Irritability.   Anxiety.  Symptoms of postpartum depression typically begin within the first month after giving birth. These symptoms include:  Difficulty sleeping or excessive sleepiness.   Marked weight loss.   Agitation.   Feelings of worthlessness.   Lack of interest in activity or food.  Postpartum psychosis is a very serious condition and can be dangerous. Fortunately, it is rare. Displaying any of the following symptoms is cause for immediate medical attention. Symptoms of postpartum psychosis include:   Hallucinations and delusions.   Bizarre or disorganized behavior.   Confusion or disorientation.  DIAGNOSIS  A diagnosis is made by an evaluation of your symptoms. There are no medical or lab tests that lead to a diagnosis, but there are various questionnaires that a health care provider may use to identify those with the baby blues, postpartum depression, or psychosis. Often, a screening tool called the [place] Postnatal Depression Scale is  used to diagnose depression in the postpartum period.  TREATMENT The baby blues usually goes away on its own in 1-2 weeks. Social support is often all that is needed. You will be encouraged to get adequate sleep and rest. Occasionally, you may be given medicines to help you sleep.  Postpartum depression requires treatment because it can last  several months or longer if it is not treated. Treatment may include individual or group therapy, medicine, or both to address any social, physiological, and psychological factors that may play a role in the depression. Regular exercise, a healthy diet, rest, and social support may also be strongly recommended.  Postpartum psychosis is more serious and needs treatment right away. Hospitalization is often needed. HOME CARE INSTRUCTIONS  Get as much rest as you can. Nap when the baby sleeps.   Exercise regularly. Some women find yoga and walking to be beneficial.   Eat a balanced and nourishing diet.   Do little things that you enjoy. Have a cup of tea, take a bubble bath, read your favorite magazine, or listen to your favorite music.  Avoid alcohol.   Ask for help with household chores, cooking, grocery shopping, or running errands as needed. Do not try to do everything.   Talk to people close to you about how you are feeling. Get support from your partner, family members, friends, or other new moms.  Try to stay positive in how you think. Think about the things you are grateful for.   Do not spend a lot of time alone.   Only take over-the-counter or prescription medicine as directed by your health care provider.  Keep all your postpartum appointments.   Let your health care provider know if you have any concerns.  SEEK MEDICAL CARE IF: You are having a reaction to or problems with your medicine. SEEK IMMEDIATE MEDICAL CARE IF:  You have suicidal feelings.   You think you may harm the baby or someone else. MAKE SURE YOU:  Understand these instructions.  Will watch your condition.  Will get help right away if you are not doing well or get worse. Document Released: 01/05/2004 Document Revised: 04/07/2013 Document Reviewed: 01/12/2013 [facility] Patient Information 2015 [place] [place]. This information is not intended to replace advice given to you by your health care  provider. Make sure you discuss any questions you have with your health care provider.

## 2014-02-10 ENCOUNTER — Encounter: Payer: Self-pay | Admitting: Obstetrics and Gynecology

## 2014-02-10 ENCOUNTER — Other Ambulatory Visit: Payer: Self-pay | Admitting: Obstetrics and Gynecology

## 2014-02-15 ENCOUNTER — Encounter: Payer: Self-pay | Admitting: Women's Health

## 2014-04-29 ENCOUNTER — Encounter (HOSPITAL_COMMUNITY): Payer: Self-pay | Admitting: Obstetrics & Gynecology

## 2014-09-07 ENCOUNTER — Encounter: Payer: Self-pay | Admitting: *Deleted

## 2014-09-08 ENCOUNTER — Ambulatory Visit: Payer: Self-pay | Admitting: Advanced Practice Midwife

## 2014-09-16 ENCOUNTER — Ambulatory Visit (INDEPENDENT_AMBULATORY_CARE_PROVIDER_SITE_OTHER): Payer: Self-pay | Admitting: Advanced Practice Midwife

## 2014-09-16 ENCOUNTER — Encounter: Payer: Self-pay | Admitting: Advanced Practice Midwife

## 2014-09-16 VITALS — BP 108/62 | HR 72 | Wt 182.0 lb

## 2014-09-16 DIAGNOSIS — Z3202 Encounter for pregnancy test, result negative: Secondary | ICD-10-CM

## 2014-09-16 DIAGNOSIS — N970 Female infertility associated with anovulation: Secondary | ICD-10-CM

## 2014-09-16 LAB — POCT URINE PREGNANCY: PREG TEST UR: NEGATIVE

## 2014-09-16 NOTE — Patient Instructions (Signed)
Anovulatory cycle 

## 2014-09-16 NOTE — Progress Notes (Signed)
[facility] Visit  Patient name: Brandy Kaufman MRN [id number]  Date of birth: Jan 27, 1991  CC & HPI:  Brandy Kaufman is a 24 y.o. Hispanic female presenting today for having a period that lasted only 3 days and was 3 weeks late.  Had SVD 8 months ago, bottlefeeding.  She never breast fed, and began having normal monthly cycles soon after delivery.  She has never skipped a period.   Pertinent History Reviewed:  Medical & Surgical Hx:   Past Medical History  Diagnosis Date  . Medical history non-contributory    Past Surgical History  Procedure Laterality Date  . Cesarean section     Family History  Problem Relation Age of Onset  . Diabetes Father   . Diabetes Paternal Grandmother     Current outpatient prescriptions:  .  ferrous sulfate 325 (65 FE) MG tablet, Take 1 tablet (325 mg total) by mouth 2 (two) times daily with a meal. (Patient not taking: Reported on 09/16/2014), Disp: 60 tablet, Rfl: 3 .  ibuprofen (ADVIL,MOTRIN) 600 MG tablet, Take 1 tablet (600 mg total) by mouth every 6 (six) hours. (Patient not taking: Reported on 09/16/2014), Disp: 30 tablet, Rfl: 0 .  Prenatal Vit-Fe Fumarate-FA (PRENATAL MULTIVITAMIN) TABS tablet, Take 1 tablet by mouth daily., Disp: , Rfl:  Social History: Reviewed -  reports that she has never smoked. She has never used smokeless tobacco.  Review of Systems:   Constitutional: Negative for fever and chills Eyes: Negative for visual disturbances Respiratory: Negative for shortness of breath, dyspnea Cardiovascular: Negative for chest pain or palpitations  Gastrointestinal: Negative for vomiting, diarrhea and constipation; no abdominal pain Genitourinary: Negative for dysuria and urgency, vaginal irritation or itching Musculoskeletal: Negative for back pain, joint pain, myalgias  Neurological: Negative for dizziness and headaches    Objective Findings:  Vitals: BP 108/62 mmHg  Pulse 72  Wt 82.555 kg (182 lb)   LMP 09/06/2014  Breastfeeding? No  Physical Examination: General appearance - alert, well appearing, and in no distress Mental status - alert, oriented to person, place, and time Chest - normal respiratory effort  Results for orders placed or performed in visit on 09/16/14 (from the past 24 hour(s))  POCT urine pregnancy   Collection Time: 09/16/14  3:54 PM  Result Value Ref Range   Preg Test, Ur Negative      50% or more of this visit was spent in counseling and coordination of care.  15 minutes of face to face time.    Assessment & Plan:  A:   anaovulatory cycle  P:  No treatment needed unless becomes recurrant. Continue condoms, discussed Plan B  CRESENZO-DISHMAN,Cassie Shedlock CNM 09/16/2014 9:57 PM   Pt no showed for her post partum pap, and although a letter was sent out, she never rescheuled.  WIll send another letter.

## 2014-09-22 ENCOUNTER — Encounter: Payer: Self-pay | Admitting: Advanced Practice Midwife

## 2014-09-23 ENCOUNTER — Encounter (HOSPITAL_COMMUNITY): Payer: Self-pay | Admitting: Obstetrics & Gynecology

## 2015-02-12 ENCOUNTER — Inpatient Hospital Stay (HOSPITAL_COMMUNITY)
Admission: AD | Admit: 2015-02-12 | Discharge: 2015-02-16 | DRG: 881 | Disposition: A | Discharge status: Home / Self Care | Payer: Federal, State, Local not specified - Other | Source: Intra-hospital | Attending: Psychiatry | Admitting: Psychiatry

## 2015-02-12 ENCOUNTER — Encounter (HOSPITAL_COMMUNITY): Payer: Self-pay | Admitting: *Deleted

## 2015-02-12 ENCOUNTER — Encounter (HOSPITAL_COMMUNITY): Payer: Self-pay | Admitting: Family Medicine

## 2015-02-12 ENCOUNTER — Emergency Department (HOSPITAL_COMMUNITY)
Admission: EM | Admit: 2015-02-12 | Discharge: 2015-02-12 | Disposition: A | Discharge status: Other Acute Inpatient Hospital | Payer: Medicaid Other | Attending: Emergency Medicine | Admitting: Emergency Medicine

## 2015-02-12 DIAGNOSIS — F321 Major depressive disorder, single episode, moderate: Secondary | ICD-10-CM | POA: Insufficient documentation

## 2015-02-12 DIAGNOSIS — F329 Major depressive disorder, single episode, unspecified: Principal | ICD-10-CM | POA: Diagnosis present

## 2015-02-12 DIAGNOSIS — T148 Other injury of unspecified body region: Secondary | ICD-10-CM | POA: Insufficient documentation

## 2015-02-12 DIAGNOSIS — G4700 Insomnia, unspecified: Secondary | ICD-10-CM | POA: Diagnosis present

## 2015-02-12 DIAGNOSIS — F419 Anxiety disorder, unspecified: Secondary | ICD-10-CM | POA: Diagnosis present

## 2015-02-12 DIAGNOSIS — F32A Depression, unspecified: Secondary | ICD-10-CM | POA: Diagnosis present

## 2015-02-12 DIAGNOSIS — Z833 Family history of diabetes mellitus: Secondary | ICD-10-CM

## 2015-02-12 LAB — I-STAT BETA HCG BLOOD, ED (MC, WL, AP ONLY): I-stat hCG, quantitative: <5 m[IU]/mL (ref <5)

## 2015-02-12 LAB — COMPREHENSIVE METABOLIC PANEL
ALT: 12 U/L — ABNORMAL LOW (ref 14–54)
ANION GAP: 8 (ref 5–15)
AST: 17 U/L (ref 15–41)
Albumin: 4.4 g/dL (ref 3.5–5.0)
Alkaline Phosphatase: 52 U/L (ref 38–126)
BILIRUBIN TOTAL: 0.4 mg/dL (ref 0.3–1.2)
BUN: 7 mg/dL (ref 6–20)
CO2: 24 mmol/L (ref 22–32)
Calcium: 9.3 mg/dL (ref 8.9–10.3)
Chloride: 103 mmol/L (ref 101–111)
Creatinine, Ser: 0.52 mg/dL (ref 0.44–1.00)
GFR calc Af Amer: >60 mL/min (ref >60)
Glucose, Bld: 93 mg/dL (ref 65–99)
Potassium: 3.7 mmol/L (ref 3.5–5.1)
Sodium: 135 mmol/L (ref 135–145)
TOTAL PROTEIN: 7.3 g/dL (ref 6.5–8.1)

## 2015-02-12 LAB — CBC
HCT: 37.9 % (ref 36.0–46.0)
Hemoglobin: 12.1 g/dL (ref 12.0–15.0)
MCH: 26.4 pg (ref 26.0–34.0)
MCHC: 31.9 g/dL (ref 30.0–36.0)
MCV: 82.6 fL (ref 78.0–100.0)
Platelets: 220 10*3/uL (ref 150–400)
RBC: 4.59 MIL/uL (ref 3.87–5.11)
RDW: 13.1 % (ref 11.5–15.5)
WBC: 8.4 10*3/uL (ref 4.0–10.5)

## 2015-02-12 LAB — RAPID URINE DRUG SCREEN, HOSP PERFORMED
Amphetamines: NOT DETECTED
BENZODIAZEPINES: NOT DETECTED
Barbiturates: NOT DETECTED
COCAINE: NOT DETECTED
Opiates: NOT DETECTED
Tetrahydrocannabinol: NOT DETECTED

## 2015-02-12 LAB — ACETAMINOPHEN LEVEL

## 2015-02-12 LAB — ETHANOL

## 2015-02-12 LAB — SALICYLATE LEVEL: Salicylate Lvl: <4 mg/dL (ref 2.8–30.0)

## 2015-02-12 MED ORDER — ACETAMINOPHEN 325 MG PO TABS: 650.0000 mg | ORAL_TABLET | Freq: Four times a day (QID) | ORAL | Status: DC | PRN

## 2015-02-12 MED ORDER — TRAZODONE HCL 50 MG PO TABS
50.0000 mg | ORAL_TABLET | Freq: Every evening | ORAL | Status: DC | PRN
  Filled 2015-02-12: qty 7

## 2015-02-12 MED ORDER — ALUM & MAG HYDROXIDE-SIMETH 200-200-20 MG/5ML PO SUSP: 30.0000 mL | ORAL | Status: DC | PRN

## 2015-02-12 MED ORDER — MAGNESIUM HYDROXIDE 400 MG/5ML PO SUSP: 30.0000 mL | Freq: Every day | ORAL | Status: DC | PRN

## 2015-02-12 NOTE — BH Assessment (Signed)
Tele Assessment Note   Brandy Kaufman is an 24 y.o. female. Pt presents voluntarily to [facility] at urging of police. Pt is cooperative and oriented x 4. Her affect is euthymic and she reports mildly anxious mood. Pt appears to downplay the seriousness of her cutting her wrists last night. Pt reports she tried to cut her wrists last night with a razor. She reports she was immediately regretful after cutting wrists. She sts she was thinking that she couldn't find a job. She reports when she cut her wrists she was "really anxious, nervous, upset." Pt reports anxiety and sadness b/c she can't find a job in [place]. She reports she and her two daughers (24 yo and 13 mos) moved out of her parents house in [place] nd moved to [place] three weeks ago. She reports she moved to be closer to 13 mo old's father. She denies hx of suicide attempts. Pt denies hx of MH treatment and denies substance use. She reports she wasn't able to answer phone early this am when parents called. She says parents called police who accompanied parents to pt's home. Pt denies hx of abuse.    Collateral info provided by brother Brandy Kaufman (lives w/ pt's parents) by phone. [name] reports concern for pt's safety. He reports this am pt's 23 yo daughter said that "my mommy cut herself and said she didn't want to live."  Diagnosis: Unspecified Depressive Disorder  Past Medical History:  Past Medical History  Diagnosis Date  . Medical history non-contributory     Past Surgical History  Procedure Laterality Date  . Cesarean section      Family History:  Family History  Problem Relation Age of Onset  . Diabetes Father   . Diabetes Paternal Grandmother     Social History:  reports that she has never smoked. She has never used smokeless tobacco. She reports that she drinks alcohol. She reports that she does not use illicit drugs.  Additional Social History:  Alcohol / Drug Use Pain Medications: pt denies  abuse Prescriptions: pt denies abuse Over the Counter: pt denies abuse History of alcohol / drug use?: No history of alcohol / drug abuse Longest period of sobriety (when/how long): n/a  CIWA: CIWA-Ar BP: 103/72 mmHg Pulse Rate: 81 COWS:    PATIENT STRENGTHS: (choose at least two) Average or above average intelligence Communication skills General fund of knowledge  Allergies: No Known Allergies  Home Medications:  (Not in a hospital admission)  OB/GYN Status:  Patient's last menstrual period was 01/16/2015.  General Assessment Data Location of Assessment: [facility] ED TTS Assessment: In system Is this a Tele or Face-to-Face Assessment?: Tele Assessment Is this an Initial Assessment or a Re-assessment for this encounter?: Initial Assessment Marital status: Single Is patient pregnant?: Unknown Pregnancy Status: Unknown Living Arrangements: Children Can pt return to current living arrangement?: Yes Admission Status: Voluntary Is patient capable of signing voluntary admission?: Yes Referral Source: Self/Family/Friend Insurance type: medicaid     Crisis Care Plan Living Arrangements: Children Name of Psychiatrist: none Name of Therapist: none  Education Status Is patient currently in school?: No Highest grade of school patient has completed: 10  Risk to self with the past 6 months Suicidal Ideation: No Has patient been a risk to self within the past 6 months prior to admission? : Yes Suicidal Intent: No Has patient had any suicidal intent within the past 6 months prior to admission? : Yes Is patient at risk for suicide?: Yes Suicidal Plan?:  No Has patient had any suicidal plan within the past 6 months prior to admission? : Yes Access to Means: Yes (pt sts threw away razors but still has access to kit knives) What has been your use of drugs/alcohol within the last 12 months?: none Previous Attempts/Gestures: Yes (last night cut wrists) How many times?: 1 Other Self  Harm Risks: none Triggers for Past Attempts: Unpredictable (anxiety, depression, can't find job) Intentional Self Injurious Behavior: None Family Suicide History: No Recent stressful life event(s): Job Loss, Financial Problems Persecutory voices/beliefs?: No Depression: No Depression Symptoms:  (pt denies sxs) Substance abuse history and/or treatment for substance abuse?: No Suicide prevention information given to non-admitted patients: Not applicable  Risk to Others within the past 6 months Homicidal Ideation: No Does patient have any lifetime risk of violence toward others beyond the six months prior to admission? : No Thoughts of Harm to Others: No Current Homicidal Intent: No Current Homicidal Plan: No Access to Homicidal Means: No Identified Victim: none History of harm to others?: No Assessment of Violence: None Noted Violent Behavior Description: pt denies hx violence Does patient have access to weapons?: No Criminal Charges Pending?: No Does patient have a court date: No Is patient on probation?: No  Psychosis Hallucinations: None noted Delusions: None noted  Mental Status Report Appearance/Hygiene: Unremarkable, In scrubs Eye Contact: Good Motor Activity: Freedom of movement Speech: Logical/coherent Level of Consciousness: Alert Mood: Euthymic, Anxious Affect: Appropriate to circumstance Anxiety Level: Minimal Thought Processes: Relevant, Coherent Judgement: Unimpaired Orientation: Person, Place, Time, Situation Obsessive Compulsive Thoughts/Behaviors: None  Cognitive Functioning Concentration: Normal Memory: Recent Intact, Remote Intact IQ: Average Insight: Fair Impulse Control: Poor Appetite: Fair Sleep: Increased Total Hours of Sleep: 9 Vegetative Symptoms: None  ADLScreening [facility] Assessment Services) Patient's cognitive ability adequate to safely complete daily activities?: Yes Patient able to express need for assistance with ADLs?:  Yes Independently performs ADLs?: Yes (appropriate for developmental age)  Prior Inpatient Therapy Prior Inpatient Therapy: No Prior Therapy Dates: na Prior Therapy Facilty/Provider(s): na Reason for Treatment: na  Prior Outpatient Therapy Prior Outpatient Therapy: No Prior Therapy Dates: na Prior Therapy Facilty/Provider(s): na Reason for Treatment: na Does patient have an ACCT team?: No Does patient have Intensive In-House Services?  : No Does patient have [facility] services? : No Does patient have P4CC services?: No  ADL Screening (condition at time of admission) Patient's cognitive ability adequate to safely complete daily activities?: Yes Is the patient deaf or have difficulty hearing?: No Does the patient have difficulty seeing, even when wearing glasses/contacts?: No Does the patient have difficulty concentrating, remembering, or making decisions?: No Patient able to express need for assistance with ADLs?: Yes Does the patient have difficulty dressing or bathing?: No Independently performs ADLs?: Yes (appropriate for developmental age) Does the patient have difficulty walking or climbing stairs?: No Weakness of Legs: None Weakness of Arms/Hands: None  Home Assistive Devices/Equipment Home Assistive Devices/Equipment: Contact lenses    Abuse/Neglect Assessment (Assessment to be complete while patient is alone) Physical Abuse: Denies Verbal Abuse: Denies Sexual Abuse: Denies Exploitation of patient/patient's resources: Denies Self-Neglect: Denies     Regulatory affairs officer (For Healthcare) Does patient have an advance directive?: No Would patient like information on creating an advanced directive?: No - patient declined information    Additional Information 1:1 In Past 12 Months?: No CIRT Risk: No Elopement Risk: No Does patient have medical clearance?: Yes     Disposition:  Disposition Initial Assessment Completed for this Encounter: Yes Disposition of  Patient: Inpatient treatment program Type of inpatient treatment program: Adult ([facility] accepts to [facility] 402-2)  Carisma Troupe P 02/12/2015 3:09 PM

## 2015-02-12 NOTE — Tx Team (Signed)
Initial Interdisciplinary Treatment Plan   PATIENT STRESSORS: Financial difficulties Marital or family conflict Occupational concerns   PATIENT STRENGTHS: Ability for insight Average or above average intelligence Communication skills General fund of knowledge Supportive family/friends   PROBLEM LIST: Problem List/Patient Goals Date to be addressed Date deferred Reason deferred Estimated date of resolution  Depression 02/12/15   D/c  Occupational concerns 02/12/15   D/c  Ineffective coping 02/12/15   D/c                                       DISCHARGE CRITERIA:  Ability to meet basic life and health needs Motivation to continue treatment in a less acute level of care Reduction of life-threatening or endangering symptoms to within safe limits  PRELIMINARY DISCHARGE PLAN: Attend aftercare/continuing care group Participate in family therapy Return to previous living arrangement Return to previous work or school arrangements  PATIENT/FAMIILY INVOLVEMENT: This treatment plan has been presented to and reviewed with the patient, Brandy Kaufman, and/or family member, .  The patient and family have been given the opportunity to ask questions and make suggestions.  Cresenciano LickCrissman, Brandy Kaufman 02/12/2015, 6:03 PM

## 2015-02-12 NOTE — ED Notes (Signed)
Pt on phone at nurses' desk. Pt noted to be tearful.

## 2015-02-12 NOTE — ED Notes (Addendum)
TTS complete - mother, brother and pt's 6-y-o daughter w/pt. Mother speaks Spanish - no [place]. Brother assisting w/interpreting per mother's request. Mother advises it will no be a good idea for pt's ex-boyfriend, Alexandro, to visit - he is the father of her 2168-month-old.

## 2015-02-12 NOTE — ED Notes (Signed)
Pt here for thoughts of suicide and attempted suicide. Pt cuts to bilateral wrists.

## 2015-02-12 NOTE — ED Notes (Signed)
Pt's belongings x 1 labeled belongings placed noted at nurses' desk for inventory. Pt aware she may give to her family rather than leaving in ED if she so wishes.

## 2015-02-12 NOTE — ED Notes (Signed)
States was brought to ED by her mother, father, sister and brother. States her parents have her 6-y-o and 2042-month-old daughters. States it is ok for them to keep her daughters while she is in ED. Pt aware of tx plan and [facility] policies - voices understanding.

## 2015-02-12 NOTE — ED Notes (Signed)
TTS being performed.  

## 2015-02-12 NOTE — Progress Notes (Signed)
Nursing admission note- This is a 24 y/o hispanic female [facility]  Please refer to teleassessment note for presenting complaint.  Patient is sad but cooperative during interview.  [place] and getting settled with her two children is a stressor and she had an impulsive moment where she made superficial cuts to he left wrist.  Denies past or current intent to suicide.  Denies SA. She is looking for employment and reports a supportive relationship with her youngest child's father.  She has no mental history and no current physical complaints. Somewhat guarded but will open up during conversation. Orientation to unit done.  She declined going to dining hall. Meal and beverage provided.  15' checks initiated.  Safety maintained.

## 2015-02-12 NOTE — ED Notes (Signed)
Called [facility] - requesting how much longer for transport. Advised she will have someone on their way.

## 2015-02-12 NOTE — Progress Notes (Signed)
D: Patient denies SI/HI and A/V hallucinations. Patient is pleasant and cooperative. Observed on phone this evening interacts with peers very little. Patient states her goal is " to focus on to feel better and be calm."  A: Patient was offered support and encouragement. Patient encouraged to voice feelings and thoughts to staff. Patient encouraged to attend groups. Q 15 minute checks in progress and maintained for safety. R:Patient stated she will try group. Patient is resting in bed with eyes open. Patient with flat and sad affect. Monitoring continues.

## 2015-02-12 NOTE — ED Notes (Signed)
Pt signed consent forms w/encouragement. Mother and brother w/pt.

## 2015-02-12 NOTE — ED Provider Notes (Signed)
CSN: [id number]     Arrival date & time 02/12/15  1145 History   First MD Initiated Contact with Patient 02/12/15 1327     Chief Complaint  Patient presents with  . Suicidal     (Consider location/radiation/quality/duration/timing/severity/associated sxs/prior Treatment) HPI Comments: Patient brought to the emergency department after being found in her home having cut her wrists. Patient reports that she has been depressed and very stressed recently. She reports that she does not have a job. She is a single mom. She reports cutting both of her wrists last night when she was feeling very depressed and suicidal, but feels improved this morning. She reports that her family called her this morning and when she did not answer the phone came with the police to get into her home and was brought to the ER to be further evaluated for ongoing issue with depression.   Past Medical History  Diagnosis Date  . Medical history non-contributory    Past Surgical History  Procedure Laterality Date  . Cesarean section     Family History  Problem Relation Age of Onset  . Diabetes Father   . Diabetes Paternal Grandmother    Social History  Substance Use Topics  . Smoking status: Never Smoker   . Smokeless tobacco: Never Used  . Alcohol Use: Yes   OB History    Gravida Para Term Preterm AB TAB SAB Ectopic Multiple Living   2 2 2       2      Review of Systems  Psychiatric/Behavioral: Positive for self-injury and dysphoric mood.  All other systems reviewed and are negative.     Allergies  Review of patient's allergies indicates no known allergies.  Home Medications   Prior to Admission medications   Medication Sig Start Date End Date Taking? Authorizing Provider  ferrous sulfate 325 (65 FE) MG tablet Take 1 tablet (325 mg total) by mouth 2 (two) times daily with a meal. Patient not taking: Reported on 09/16/2014 12/15/13   William DaltonMorgan McEachern, MD  ibuprofen (ADVIL,MOTRIN) 600 MG tablet Take 1  tablet (600 mg total) by mouth every 6 (six) hours. Patient not taking: Reported on 09/16/2014 12/15/13   William DaltonMorgan McEachern, MD  Prenatal Vit-Fe Fumarate-FA (PRENATAL MULTIVITAMIN) TABS tablet Take 1 tablet by mouth daily.    Historical Provider, MD   BP 103/72 mmHg  Pulse 81  Temp(Src) 98.3 F (36.8 C)  Resp 18  SpO2 98%  LMP 01/16/2015 Physical Exam  Constitutional: She is oriented to person, place, and time. She appears well-developed and well-nourished. No distress.  HENT:  Head: Normocephalic and atraumatic.  Right Ear: Hearing normal.  Left Ear: Hearing normal.  Nose: Nose normal.  Mouth/Throat: Oropharynx is clear and moist and mucous membranes are normal.  Eyes: Conjunctivae and EOM are normal. Pupils are equal, round, and reactive to light.  Neck: Normal range of motion. Neck supple.  Cardiovascular: Regular rhythm, S1 normal and S2 normal.  Exam reveals no gallop and no friction rub.   No murmur heard. Pulmonary/Chest: Effort normal and breath sounds normal. No respiratory distress. She exhibits no tenderness.  Abdominal: Soft. Normal appearance and bowel sounds are normal. There is no hepatosplenomegaly. There is no tenderness. There is no rebound, no guarding, no tenderness at McBurney's point and negative Murphy's sign. No hernia.  Musculoskeletal: Normal range of motion.  Neurological: She is alert and oriented to person, place, and time. She has normal strength. No cranial nerve deficit or sensory deficit. Coordination normal.  GCS eye subscore is 4. GCS verbal subscore is 5. GCS motor subscore is 6.  Skin: Skin is warm, dry and intact. No rash noted. No cyanosis.  Multiple linear abrasions on bilateral volar wrists  Psychiatric: She has a normal mood and affect. Her speech is normal and behavior is normal. Thought content normal.  Nursing note and vitals reviewed.   ED Course  Procedures (including critical care time) Labs Review Labs Reviewed  COMPREHENSIVE METABOLIC  PANEL - Abnormal; Notable for the following:    ALT 12 (*)    All other components within normal limits  ACETAMINOPHEN LEVEL - Abnormal; Notable for the following:    Acetaminophen (Tylenol), Serum <10 (*)    All other components within normal limits  ETHANOL  SALICYLATE LEVEL  CBC  URINE RAPID DRUG SCREEN, HOSP PERFORMED  I-STAT BETA HCG BLOOD, ED ([facility], [facility], [facility] ONLY)    Imaging Review No results found. I have personally reviewed and evaluated these images and lab results as part of my medical decision-making.   EKG Interpretation None      MDM   Final diagnoses:  None   depression  Suicidal gesture  Presents to the emergency department for depression. Patient admits to having depression and increased social stressors. She did cut both of her wrists last night, but they're very superficial linear abrasions, did not require repair. Patient is not actively homicidal or suicidal. Family seemed very concerned about the patient's recent behavior, however, proximal at least has to gain access to her apartment and found her with a wrists cut. She will require psychiatric evaluation to determine disposition.    Gilda Crease, MD 02/12/15 1336

## 2015-02-13 DIAGNOSIS — F321 Major depressive disorder, single episode, moderate: Secondary | ICD-10-CM

## 2015-02-13 MED ORDER — CITALOPRAM HYDROBROMIDE 10 MG PO TABS
10.0000 mg | ORAL_TABLET | Freq: Every day | ORAL | Status: DC
  Administered 2015-02-13 – 2015-02-15 (×3): 10 mg via ORAL
  Filled 2015-02-13 (×4): qty 1

## 2015-02-13 NOTE — [facility] Group Notes (Signed)
[facility] Group Notes:  (Clinical Social Work)  02/13/2015  1:30-2:30pm  Summary of Progress/Problems:   The main focus of today's process group was to   1)  discuss the importance of adding supports  2)  define health supports versus unhealthy supports  3)  identify the patient's current unhealthy supports and plan how to handle them  4)  Identify the patient's current healthy supports and plan what to add.  An emphasis was placed on using counselor, doctor, therapy groups, 12-step groups, and problem-specific support groups to expand supports.    The patient expressed full comprehension of the concepts presented, and agreed that there is a need to add more supports.  The patient stated she wishes her parents were more understanding and supportive instead of thinking she does things for attention.  She did not talk the remainder of group but listened carefully, following the conversation with her eyes.  Type of Therapy:  Process Group with Motivational Interviewing  Participation Level:  Active  Participation Quality:  Attentive  Affect:  Depressed and Flat  Cognitive:  Oriented  Insight:  Developing/Improving  Engagement in Therapy:  Engaged  Modes of Intervention:   Education, Support and Processing, Activity  Ambrose MantleMareida Grossman-Orr, LCSW 02/13/2015

## 2015-02-13 NOTE — [facility] Suicide Risk Assessment (Signed)
[facility]   Nursing information obtained from:    Demographic factors:    Current Mental Status:    Loss Factors:    Historical Factors:    Risk Reduction Factors:    Total Time spent with patient: 1.5 hours Principal Problem: <principal problem not specified> Diagnosis:   Patient Active Problem List   Diagnosis Date Noted  . MDD (major depressive disorder) (HCC) [F32.9] 02/12/2015  . Postpartum depression [F53] 01/27/2014  . VBAC, delivered, current hospitalization [O34.219] 12/13/2013  . 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 Abnormal Pap smear of cervix [R87.619] 06/08/2013  . Vaginal septum affecting pregnancy [O34.60] 06/08/2013  . Previous cesarean section [Z98.891] 06/01/2013     Continued Clinical Symptoms:  Alcohol Use Disorder Identification Test Final Score (AUDIT): 0 The "Alcohol Use Disorders Identification Test", Guidelines for Use in Primary Care, Second Edition.  World Science writerHealth Organization [facility](WHO). Score between 0-7:  no or low risk or alcohol related problems. Score between 8-15:  moderate risk of alcohol related problems. Score between 16-19:  high risk of alcohol related problems. Score 20 or above:  warrants further diagnostic evaluation for alcohol dependence and treatment.   CLINICAL FACTORS:   Depression:   Anhedonia Hopelessness Insomnia Dysthymia   Musculoskeletal: Strength & Muscle Tone: within normal limits Gait & Station: normal Patient leans: N/A  Psychiatric Specialty Exam: Physical Exam  Constitutional: She appears well-developed and well-nourished. No distress.  Skin: She is not diaphoretic.    Review of Systems  Constitutional: Negative.   Cardiovascular: Negative for chest pain.  Skin: Negative for rash.  Neurological: Negative for tremors.  Psychiatric/Behavioral: Positive for depression. The patient is nervous/anxious.     Blood pressure 96/66, pulse 83, temperature 98.2 F (36.8 C), temperature source  Oral, resp. rate 16, height 5\' 4"  (1.626 m), weight 74.844 kg (165 lb), last menstrual period 01/16/2015, SpO2 95 %, not currently breastfeeding.Body mass index is 28.31 kg/(m^2).  General Appearance: Casual  Eye Contact::  Fair  Speech:  Slow  Volume:  Decreased  Mood:  Dysphoric  Affect:  Congruent  Thought Process:  Coherent  Orientation:  Full (Time, Place, and Person)  Thought Content:  Rumination  Suicidal Thoughts:  No  Homicidal Thoughts:  No  Memory:  Immediate;   Fair Recent;   Fair  Judgement:  Poor  Insight:  Shallow  Psychomotor Activity:  Decreased  Concentration:  Fair  Recall:  F[employer]Fair  Fund of Knowledge:Fair  Language: Fair  Akathisia:  Negative  Handed:  Right  AIMS (if indicated):     Assets:  Desire for Improvement  Sleep:  Number of Hours: 6.25  Cognition: WNL  ADL's:  Intact     COGNITIVE FEATURES THAT CONTRIBUTE TO RISK:  Closed-mindedness    SUICIDE RISK:   Moderate:  Frequent suicidal ideation with limited intensity, and duration, some specificity in terms of plans, no associated intent, good self-control, limited dysphoria/symptomatology, some risk factors present, and identifiable protective factors, including available and accessible social support.  PLAN OF CARE: Admit for stabilization. Medication management for depression and safety.   Medical Decision Making:  Review of Psycho-Social Stressors (1), Review of Last Therapy Session (1) and Review of New Medication or Change in Dosage (2)  I certify that inpatient services furnished can reasonably be expected to improve the patient's condition.   [place] 02/13/2015, 9:47 AM

## 2015-02-13 NOTE — Plan of Care (Signed)
Problem: Diagnosis: Increased Risk For Suicide Attempt Goal: STG-Patient Will Attend All Groups On The Unit Outcome: Progressing Patient attended wrap up group this evening.     

## 2015-02-13 NOTE — B[facility] Counselor (Signed)
Adult Comprehensive Assessment  Patient ID: [place], female   DOB: 08/10/1990, 24 y.o.   MRN: [id number]  Information Source: Information source: Patient  Current Stressors:  Employment / Job issues: unemployed, having a hard time finding employment  Living/Environment/Situation:  Living Arrangements: Children Living conditions (as described by patient or guardian): Pt lives with 2 daughters in [place].  Pt reports recently moving from [place] with parents into own apartment in [place].  How long has patient lived in current situation?: 1 week What is atmosphere in current home: Comfortable  Family History:  Marital status: Long term relationship Long term relationship, how long?: 3.5 years What types of issues is patient dealing with in the relationship?: none reported, pt states that her boyfriend is supportive and this is a good relationship.  Additional relationship information: N/A Does patient have children?: Yes How many children?: 2 How is patient's relationship with their children?: 69 year old and 64 month old daughters  Childhood History:  By whom was/is the patient raised?: Both parents Additional childhood history information: Pt reports being raised by her grandmother and parents.  Pt describes her childhood as happy.   Description of patient's relationship with caregiver when they were a child: Pt reports getting along well with family growing up.  Patient's description of current relationship with people who raised him/her: Pt reports still getting along well with parents in [place] and grandmother in [place].   Does patient have siblings?: Yes Number of Siblings: 4 Description of patient's current relationship with siblings: 3 brothers, 1 sister - pt reports getting along with parents "somewhat" Did patient suffer any verbal/emotional/physical/sexual abuse as a child?: No Did patient suffer from severe childhood neglect?: No Has patient  ever been sexually abused/assaulted/raped as an adolescent or adult?: No Was the patient ever a victim of a crime or a disaster?: No Witnessed domestic violence?: No Has patient been effected by domestic violence as an adult?: No  Education:  Highest grade of school patient has completed: 10th grade Currently a student?: No Learning disability?: No  Employment/Work Situation:   Employment situation: Unemployed Patient's job has been impacted by current illness: No What is the longest time patient has a held a job?: 6 years 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 [employer] Has patient ever been in the [employer]?: No Has patient ever served in Buyer, retail?: No  Financial Resources:   Surveyor, quantity resources: No income Does patient have a Lawyer or guardian?: No  Alcohol/Substance Abuse:   What has been your use of drugs/alcohol within the last 12 months?: pt denies If attempted suicide, did drugs/alcohol play a role in this?: No Alcohol/Substance Abuse Treatment Hx: Denies past history Has alcohol/substance abuse ever caused legal problems?: No  Social Support System:   Conservation officer, nature Support System: Production assistant, radio System: pt reports family and boyfriend are supportive Type of faith/religion: Catholic How does patient's faith help to cope with current illness?: prayer  Leisure/Recreation:   Leisure and Hobbies: exercising  Strengths/Needs:   What things does the patient do well?: cooking In what areas does patient struggle / problems for patient: depression, SI  Discharge Plan:   Does patient have access to transportation?: Yes Will patient be returning to same living situation after discharge?: Yes Currently receiving community mental health services: No If no, would patient like referral for services when discharged?: Yes (What county?) [place]) Does patient have financial barriers related to discharge medications?:  No  Summary/Recommendations:  Patient is a [age] year old Hispanic female with a diagnosis of Major Depressive Disorder.  Patient lives in [name] with her two daughters.  Pt reports being overwhelmed with recently moving to [place] and struggling to find employment.  Pt reports being upset and depressed on Friday and cutting her wrist as a suicidal gesture.  Pt reports children were asleep when she did this.  Pt reports no insurance (for referral purposes) and has no current outpatient providers.  Discussed [facility] as an option in which pt accepted this referral.  Patient will benefit from crisis stabilization, medication evaluation, group therapy and psycho education in addition to case management for discharge planning. Discharge Process and Patient Expectations information sheet signed by patient, witnessed by writer and inserted in patient's shadow chart.    Pt is not a smoker so [name] Quitline N/A.   [place], Salome Arnthelsea Nicole. 02/13/2015

## 2015-02-13 NOTE — Progress Notes (Signed)
  Psychoeducational Group Note  Date: 02/13/2015 Time:  0930 Group Topic/Focus:  Gratefulness:  The focus of this group is to help patients identify what two things they are most grateful for in their lives. What helps ground them and to center them on their work to their recovery.  Participation Level:  Active  Participation Quality:  Appropriate  Affect:  Appropriate  Cognitive:  Oriented  Insight:  Improving  Engagement in Group:  Engaged  Additional Comments:  Pt was attentive and participated in the group.   Brinden Kincheloe A   

## 2015-02-13 NOTE — [facility] Group Notes (Signed)
[facility] Group Notes:  (Nursing/MHT/Case Management/Adjunct)  Date:  02/13/2015  Time:  1045  Type of Therapy:  Nurse Education  /  Healthy Support Systems : The group is focused on teaching patients the importance of developing and maintaining healthy support systems.  Participation Level:  Active  Participation Quality:  Attentive  Affect:  Appropriate  Cognitive:  Appropriate  Insight:  Good  Engagement in Group:  Engaged  Modes of Intervention:  Education  Summary of Progress/Problems:  [place], [place] 02/13/2015, 2:04 PM

## 2015-02-13 NOTE — H&P (Signed)
Psychiatric Admission Assessment Adult  Patient Identification: Brandy Kaufman MRN:  [id number] Date of Evaluation:  02/13/2015 Chief Complaint:  DEPRESSION Principal Diagnosis: MDD (major depressive disorder) ([place]) Diagnosis:   Patient Active Problem List   Diagnosis Date Noted  . MDD (major depressive disorder) ([place]) [F32.9] 02/12/2015    Priority: High  . Postpartum depression [F53] 01/27/2014  . VBAC, delivered, current hospitalization [O34.219] 12/13/2013  . 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 Abnormal Pap smear of cervix [R87.619] 06/08/2013  . Vaginal septum affecting pregnancy [O34.60] 06/08/2013  . Previous cesarean section [Z98.891] 06/01/2013   History of Present Illness:  Brandy Kaufman is an 24 y.o. female. Pt presents voluntarily to [facility] family at urging of police. Pt is cooperative and oriented x 4. Her affect is euthymic and she reports mildly anxious mood. Pt appears to downplay the seriousness of her cutting her wrists last night. Pt reports she tried to cut her wrists last night with a razor. She reports she was immediately regretful after cutting wrists. She sts she was thinking that she couldn't find a job. She reports when she cut her wrists she was "really anxious, nervous, upset." Pt reports anxiety and sadness b/c she can't find a job in [place]. She reports she and her two daughers (24 yo and 13 mos) moved out of her parents house in [place] and moved to [place] three weeks ago. She reports she moved to be closer to 13 mo old's father. She denies hx of suicide attempts. Pt denies hx of MH treatment and denies substance use. She reports she wasn't able to answer phone early this am when parents called. She says parents called police who accompanied parents to pt's home. Pt denies hx of abuse.   Today patient was seen.  She verified above information that was written by TTS counselor.  She states that she had been impulsive.  She is looking for a  job and had been unsuccessful.  This caused her sad mood to worsen.  However, she states that she loves her children too much to hurt her self.  She states that she has 2 supportive parents that help her with childcare.  She is denying HI and AVH.  She is goal oriented and looks forward to being discharged.    Associated Signs/Symptoms: Depression Symptoms:  depressed mood, hopelessness, anxiety, (Hypo) Manic Symptoms:  Elevated Mood, Impulsivity, Irritable Mood, Labiality of Mood, Anxiety Symptoms:  Excessive Worry, Psychotic Symptoms:  NA PTSD Symptoms: NA Total Time spent with patient: 45 minutes  Past Psychiatric History: Denies  Risk to Self: What has been your use of drugs/alcohol within the last 12 months?: pt denies Risk to Others:   Prior Inpatient Therapy:   Prior Outpatient Therapy:    Alcohol Screening: 1. How often do you have a drink containing alcohol?: Never 9. Have you or someone else been injured as a result of your drinking?: No 10. Has a relative or friend or a doctor or another health worker been concerned about your drinking or suggested you cut down?: No Alcohol Use Disorder Identification Test Final Score (AUDIT): 0 Brief Intervention: AUDIT score less than 7 or less-screening does not suggest unhealthy drinking-brief intervention not indicated Substance Abuse History in the last 12 months:  No. Consequences of Substance Abuse: NA Previous Psychotropic Medications: No  Psychological Evaluations: Yes  Past Medical History:  Past Medical History  Diagnosis Date  . Medical history non-contributory     Past Surgical History  Procedure Laterality  Date  . Cesarean section     Family History:  Family History  Problem Relation Age of Onset  . Diabetes Father   . Diabetes Paternal Grandmother    Family Psychiatric  History: Denies Social History:  History  Alcohol Use  . Yes     History  Drug Use No    Social History   Social History  .  Marital Status: Single    Spouse Name: N/A  . Number of Children: N/A  . Years of Education: N/A   Social History Main Topics  . Smoking status: Never Smoker   . Smokeless tobacco: Never Used  . Alcohol Use: Yes  . Drug Use: No  . Sexual Activity: Yes    Birth Control/ Protection: None   Other Topics Concern  . None   Social History Narrative   Additional Social History:    Pain Medications: pt denies abuse Prescriptions: pt denies abuse Over the Counter: pt denies abuse   Allergies:  No Known Allergies Lab Results:  Results for orders placed or performed during the hospital encounter of 02/12/15 (from the past 48 hour(s))  Urine rapid drug screen (hosp performed) (Not at [facility])     Status: None   Collection Time: 02/12/15 12:07 PM  Result Value Ref Range   Opiates NONE DETECTED NONE DETECTED   Cocaine NONE DETECTED NONE DETECTED   Benzodiazepines NONE DETECTED NONE DETECTED   Amphetamines NONE DETECTED NONE DETECTED   Tetrahydrocannabinol NONE DETECTED NONE DETECTED   Barbiturates NONE DETECTED NONE DETECTED    Comment:        DRUG SCREEN FOR MEDICAL PURPOSES ONLY.  IF CONFIRMATION IS NEEDED FOR ANY PURPOSE, NOTIFY LAB WITHIN 5 DAYS.        LOWEST DETECTABLE LIMITS FOR URINE DRUG SCREEN Drug Class       Cutoff (ng/mL) Amphetamine      1000 Barbiturate      200 Benzodiazepine   322 Tricyclics       025 Opiates          300 Cocaine          300 THC              50   Comprehensive metabolic panel     Status: Abnormal   Collection Time: 02/12/15 12:08 PM  Result Value Ref Range   Sodium 135 135 - 145 mmol/L   Potassium 3.7 3.5 - 5.1 mmol/L   Chloride 103 101 - 111 mmol/L   CO2 24 22 - 32 mmol/L   Glucose, Bld 93 65 - 99 mg/dL   BUN 7 6 - 20 mg/dL   Creatinine, Ser 0.52 0.44 - 1.00 mg/dL   Calcium 9.3 8.9 - 10.3 mg/dL   Total Protein 7.3 6.5 - 8.1 g/dL   Albumin 4.4 3.5 - 5.0 g/dL   AST 17 15 - 41 U/L   ALT 12 (L) 14 - 54 U/L   Alkaline Phosphatase 52  38 - 126 U/L   Total Bilirubin 0.4 0.3 - 1.2 mg/dL   GFR calc non Af Amer >60 >60 mL/min   GFR calc Af Amer >60 >60 mL/min    Comment: (NOTE) The eGFR has been calculated using the CKD EPI equation. This calculation has not been validated in all clinical situations. eGFR's persistently <60 mL/min signify possible Chronic Kidney Disease.    Anion gap 8 5 - 15  Ethanol (ETOH)     Status: None   Collection Time: 02/12/15 12:08  PM  Result Value Ref Range   Alcohol, Ethyl (B) <5 <5 mg/dL    Comment:        LOWEST DETECTABLE LIMIT FOR SERUM ALCOHOL IS 5 mg/dL FOR MEDICAL PURPOSES ONLY   Salicylate level     Status: None   Collection Time: 02/12/15 12:08 PM  Result Value Ref Range   Salicylate Lvl <4.8 2.8 - 30.0 mg/dL  Acetaminophen level     Status: Abnormal   Collection Time: 02/12/15 12:08 PM  Result Value Ref Range   Acetaminophen (Tylenol), Serum <10 (L) 10 - 30 ug/mL    Comment:        THERAPEUTIC CONCENTRATIONS VARY SIGNIFICANTLY. A RANGE OF 10-30 ug/mL MAY BE AN EFFECTIVE CONCENTRATION FOR MANY PATIENTS. HOWEVER, SOME ARE BEST TREATED AT CONCENTRATIONS OUTSIDE THIS RANGE. ACETAMINOPHEN CONCENTRATIONS >150 ug/mL AT 4 HOURS AFTER INGESTION AND >50 ug/mL AT 12 HOURS AFTER INGESTION ARE OFTEN ASSOCIATED WITH TOXIC REACTIONS.   CBC     Status: None   Collection Time: 02/12/15 12:08 PM  Result Value Ref Range   WBC 8.4 4.0 - 10.5 K/uL   RBC 4.59 3.87 - 5.11 MIL/uL   Hemoglobin 12.1 12.0 - 15.0 g/dL   HCT 37.9 36.0 - 46.0 %   MCV 82.6 78.0 - 100.0 fL   MCH 26.4 26.0 - 34.0 pg   MCHC 31.9 30.0 - 36.0 g/dL   RDW 13.1 11.5 - 15.5 %   Platelets 220 150 - 400 K/uL  I-Stat beta hCG blood, ED ([facility], [facility], [facility] only)     Status: None   Collection Time: 02/12/15 12:22 PM  Result Value Ref Range   I-stat hCG, quantitative <5.0 <5 mIU/mL   Comment 3            Comment:   GEST. AGE      CONC.  (mIU/mL)   <=1 WEEK        5 - 50     2 WEEKS       50 - 500     3 WEEKS       100 -  10,000     4 WEEKS     1,000 - 30,000        FEMALE AND NON-PREGNANT FEMALE:     LESS THAN 5 mIU/mL     Metabolic Disorder Labs:  No results found for: HGBA1C, MPG No results found for: PROLACTIN No results found for: CHOL, TRIG, HDL, CHOLHDL, VLDL, LDLCALC  Current Medications: Current Facility-Administered Medications  Medication Dose Route Frequency Provider Last Rate Last Dose  . acetaminophen (TYLENOL) tablet 650 mg  650 mg Oral Q6H PRN Kerrie Buffalo, NP      . alum & mag hydroxide-simeth (MAALOX/MYLANTA) 200-200-20 MG/5ML suspension 30 mL  30 mL Oral Q4H PRN Kerrie Buffalo, NP      . citalopram (CELEXA) tablet 10 mg  10 mg Oral Daily Merian Capron, MD   10 mg at 02/13/15 1158  . magnesium hydroxide (MILK OF MAGNESIA) suspension 30 mL  30 mL Oral Daily PRN Kerrie Buffalo, NP      . traZODone (DESYREL) tablet 50 mg  50 mg Oral QHS PRN Kerrie Buffalo, NP       PTA Medications: No prescriptions prior to admission    Musculoskeletal: Strength & Muscle Tone: within normal limits Gait & Station: normal Patient leans: N/A  Psychiatric Specialty Exam: Physical Exam  Vitals reviewed. Psychiatric: Her mood appears anxious. Thought content is not paranoid and not delusional.  She exhibits a depressed mood. She expresses no homicidal and no suicidal ideation. She expresses no suicidal plans and no homicidal plans.    Review of Systems  All other systems reviewed and are negative.   Blood pressure 96/66, pulse 83, temperature 98.2 F (36.8 C), temperature source Oral, resp. rate 16, height _0  (1.626 m), weight 74.844 kg (165 lb), last menstrual period 01/16/2015, SpO2 95 %, not currently breastfeeding.Body mass index is 28.31 kg/(m^2).   General Appearance: Casual  Eye Contact:: Fair  Speech: Slow  Volume: Decreased  Mood: Dysphoric  Affect: Congruent  Thought Process: Coherent  Orientation: Full (Time, Place, and Person)  Thought Content: Rumination   Suicidal Thoughts: No  Homicidal Thoughts: No  Memory: Immediate; Fair Recent; Fair  Judgement: Poor  Insight: Shallow  Psychomotor Activity: Decreased  Concentration: Fair  Recall: [place]: Fair  Akathisia: Negative  Handed: Right  AIMS (if indicated):    Assets: Desire for Improvement  Sleep: Number of Hours: 6.25  Cognition: WNL  ADL's: Intact       Treatment Plan Summary: Admit for crisis management and mood stabilization. Medication management to re-stabilize current mood symptoms Group counseling sessions for coping skills Medical consults as needed Review and reinstate any pertinent home medications for other health problems  Observation Level/Precautions:  15 minute checks  Laboratory:  per ED  Psychotherapy:  Group  Medications:  Celexa 10 mg QD  Consultations:  Psych  Discharge Concerns:  Safety  Estimated LOS:  3-5 days  Other:     I certify that inpatient services furnished can reasonably be expected to improve the patient's condition.   Freda Munro May Agustin AGNP-BC 10/30/20161:17 PM I have examined the patient and agreed with the findings of H&P and treatment plan. I also have done suicide assessment on this patient.

## 2015-02-13 NOTE — Progress Notes (Signed)
Pt declines both the pneumonia and flu vaccination

## 2015-02-13 NOTE — Progress Notes (Signed)
Adult Psychoeducational Group Note  Date:  02/13/2015 Time:  9:01 PM  Group Topic/Focus:  Wrap-Up Group:   The focus of this group is to help patients review their daily goal of treatment and discuss progress on daily workbooks.  Participation Level:  Active  Participation Quality:  Appropriate and Attentive  Affect:  Appropriate  Cognitive:  Appropriate  Insight: Appropriate and Good  Engagement in Group:  Engaged  Modes of Intervention:  Education  Additional Comments:  Pt overall had a great day. Pt goal was to interact more and stay positive.   Merlinda FrederickKeshia S Elizaveta Mattice 02/13/2015, 9:01 PM

## 2015-02-13 NOTE — Plan of Care (Signed)
Problem: Diagnosis: Increased Risk For Suicide Attempt Goal: STG-Patient Will Report Suicidal Feelings to Staff Outcome: Progressing Patient currently denies suicidal ideations.      

## 2015-02-13 NOTE — Progress Notes (Signed)
Writer observed patient lying in her bed with lights off when entering her room she was awake but was tearful. She reported that her daughters visited her this evening and this was pretty rough for her when they left. She reports that she has been attending groups and has been resting well since being here without any sleep aid. She reports that not being able to find a job and now living here in [place] without being around her friends was a lot and she made the attempt by cutting her wrist. She reports that she has no one to talk to during the day when her oldest daughter is at school and her younger daughter may be napping. She is hopeful to discharge soon so that she can be with her girls. She reports that her parents and the girls father are all supportive. She denies si/hi/a/v hallucinations. Support and encouragement given, safety maintained on unit with 15 min checks.

## 2015-02-14 DIAGNOSIS — F321 Major depressive disorder, single episode, moderate: Secondary | ICD-10-CM | POA: Diagnosis not present

## 2015-02-14 NOTE — Progress Notes (Signed)
Adult Psychoeducational Group Note  Date:  02/14/2015 Time:  10:04 PM  Group Topic/Focus:  Wrap-Up Group:   The focus of this group is to help patients review their daily goal of treatment and discuss progress on daily workbooks.  Participation Level:  Active  Participation Quality:  Appropriate and Attentive  Affect:  Appropriate  Cognitive:  Appropriate  Insight: Appropriate and Good  Engagement in Group:  Engaged  Modes of Intervention:  Education  Additional Comments:  Pt goal was to go home today. Even though pt did not discharge, patient overall had a good day.   Merlinda FrederickKeshia S Loralyn Rachel 02/14/2015, 10:04 PM

## 2015-02-14 NOTE — [facility] Group Notes (Signed)
[facility] Note  02/14/2015 8:45 AM  Participation Quality: Alert, Appropriate and Oriented  Mood/Affect: Flat  Depression Rating: 0  Anxiety Rating: 0  Thoughts of Suicide: Pt denies SI/HI  Will you contract for safety? Yes  Current AVH: Pt denies  Plan for Discharge/Comments: Pt attended discharge planning group and actively participated in group. CSW discussed suicide prevention education with the group and encouraged them to discuss discharge planning and any relevant barriers. Pt minimal in group discussion; reports feeling better today. Agreeable to referral to [facility]  Transportation Means: Pt reports access to transportation  Supports: No supports mentioned at this time  Chad CordialLauren Carter, LCSWA 02/14/2015 9:22 AM

## 2015-02-14 NOTE — Progress Notes (Signed)
D: Pt presents with flat affect and depressed mood. Pt cautious and guarded during shift assessment. Pt forwarded little information and appears to be minimizing her symptoms. Pt stated that she's not suicidal today and reported that she last felt depressed on Friday. Pt denies depression. Pt reported fair sleep and appetite. Pt requesting to be discharged home today. Pt compliant with taking meds and no side effects verbalized by pt. A: Medications administered as ordered per MD. Verbal support given. Pt encouraged to attend groups. 15 minute checks performed for safety. R: Pt safety maintained.

## 2015-02-14 NOTE — [facility] Suicide Risk Assessment (Signed)
[facility] INPATIENT:  Family/Significant Other Suicide Prevention Education  Suicide Prevention Education:  Education Completed; Brandy Kaufman, Pt's boyfriend [phone],  (name of family member/significant other) has been identified by the patient as the family member/significant other with whom the patient will be residing, and identified as the person(s) who will aid the patient in the event of a mental health crisis (suicidal ideations/suicide attempt).  With written consent from the patient, the family member/significant other has been provided the following suicide prevention education, prior to the and/or following the discharge of the patient- interpreter used, ID [id number].  The suicide prevention education provided includes the following:  Suicide risk factors  Suicide prevention and interventions  National Suicide Hotline telephone number  [facility] assessment telephone number  [facility] Emergency Assistance 911  [facility] and/or Residential Mobile Crisis Unit telephone number  Request made of family/significant other to:  Remove weapons (e.g., guns, rifles, knives), all items previously/currently identified as safety concern.    Remove drugs/medications (over-the-counter, prescriptions, illicit drugs), all items previously/currently identified as a safety concern.  The family member/significant other verbalizes understanding of the suicide prevention education information provided.  The family member/significant other agrees to remove the items of safety concern listed above.  Brandy Kaufman, Kyvon Hu M 02/14/2015, 3:31 PM

## 2015-02-14 NOTE — [facility] Group Notes (Signed)
[facility] LCSW Group Therapy  02/14/2015 1:15pm  Type of Therapy:  Group Therapy vercoming Obstacles  Participation Level:  Minimal  Participation Quality:  N/A  Affect:  flat  Cognitive:  Appropriate and Oriented  Insight:  Developing/Improving and Improving  Engagement in Therapy:  Limited  Modes of Intervention:  Discussion, Exploration, Problem-solving and Support  Description of Group:   In this group patients will be encouraged to explore what they see as obstacles to their own wellness and recovery. They will be guided to discuss their thoughts, feelings, and behaviors related to these obstacles. The group will process together ways to cope with barriers, with attention given to specific choices patients can make. Each patient will be challenged to identify changes they are motivated to make in order to overcome their obstacles. This group will be process-oriented, with patients participating in exploration of their own experiences as well as giving and receiving support and challenge from other group members.  Summary of Patient Progress: Pt did not participate in group therapy, however was observed to be attentive to group discussion.    Therapeutic Modalities:   Cognitive Behavioral Therapy Solution Focused Therapy Motivational Interviewing Relapse Prevention Therapy   Chad CordialLauren Carter, LCSWA 02/14/2015 2:53 PM

## 2015-02-14 NOTE — Progress Notes (Signed)
[facility] MD Progress Note  02/14/2015 12:35 PM Brandy Kaufman  MRN:  [id number] Subjective:   Patient reports she is feeling better today- less depressed .  She denies medication side effects at this time. She is hoping for discharge soon and is hoping to reunite with her husband and children soon. Objective : I have discussed case with treatment team and have met with patient . Patient is a 24 year old married woman, has two young daughters, who are currently in custody of patient's mother. Patient reports she had felt increasingly depressed particularly after moving from [place] to [place] area, where she has less social support , and difficulty in finding a job. She impulsively cut wrist leading to inpatient admission. States this was impulsive, not planned out, and states " I regret having done it, I feel like I was not thinking of my children and husband at that time" At present minimizes depression, and reports her mood is "OK" today. Denies significant neuro- vegetative symptoms of depression. She has been visible on unit, has been going to groups , behavior in good control. She has reported her depression and anxiety as much improved . At this time she is on Celexa 10 mgrs QDAY - was started upon admission- denies any side effects thus far .   Principal Problem: MDD (major depressive disorder) ([place]) Diagnosis:   Patient Active Problem List   Diagnosis Date Noted  . MDD (major depressive disorder) ([place]) [F32.9] 02/12/2015  . Postpartum depression [F53] 01/27/2014  . VBAC, delivered, current hospitalization [O34.219] 12/13/2013  . 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 Abnormal Pap smear of cervix [R87.619] 06/08/2013  . Vaginal septum affecting pregnancy [O34.60] 06/08/2013  . Previous cesarean section [Z98.891] 06/01/2013   Total Time spent with patient: 20 minutes   Past Medical History:  Past Medical History  Diagnosis Date  . Medical history non-contributory      Past Surgical History  Procedure Laterality Date  . Cesarean section     Family History:  Family History  Problem Relation Age of Onset  . Diabetes Father   . Diabetes Paternal Grandmother     Social History:  History  Alcohol Use  . Yes     History  Drug Use No    Social History   Social History  . Marital Status: Single    Spouse Name: N/A  . Number of Children: N/A  . Years of Education: N/A   Social History Main Topics  . Smoking status: Never Smoker   . Smokeless tobacco: Never Used  . Alcohol Use: Yes  . Drug Use: No  . Sexual Activity: Yes    Birth Control/ Protection: None   Other Topics Concern  . None   Social History Narrative   Additional Social History:    Pain Medications: pt denies abuse Prescriptions: pt denies abuse Over the Counter: pt denies abuse  Sleep: Good  Appetite:  Good  Current Medications: Current Facility-Administered Medications  Medication Dose Route Frequency Provider Last Rate Last Dose  . acetaminophen (TYLENOL) tablet 650 mg  650 mg Oral Q6H PRN Kerrie Buffalo, NP      . alum & mag hydroxide-simeth (MAALOX/MYLANTA) 200-200-20 MG/5ML suspension 30 mL  30 mL Oral Q4H PRN Kerrie Buffalo, NP      . citalopram (CELEXA) tablet 10 mg  10 mg Oral Daily Merian Capron, MD   10 mg at 02/14/15 0806  . magnesium hydroxide (MILK OF MAGNESIA) suspension 30 mL  30 mL Oral Daily  PRN Kerrie Buffalo, NP      . traZODone (DESYREL) tablet 50 mg  50 mg Oral QHS PRN Kerrie Buffalo, NP        Lab Results: No results found for this or any previous visit (from the past 48 hour(s)).  Physical Findings: AIMS: Facial and Oral Movements Muscles of Facial Expression: None, normal Lips and Perioral Area: None, normal Jaw: None, normal Tongue: None, normal,Extremity Movements Upper (arms, wrists, hands, fingers): None, normal Lower (legs, knees, ankles, toes): None, normal, Trunk Movements Neck, shoulders, hips: None, normal, Overall  Severity Severity of abnormal movements (highest score from questions above): None, normal Incapacitation due to abnormal movements: None, normal Patient's awareness of abnormal movements (rate only patient's report): No Awareness, Dental Status Current problems with teeth and/or dentures?: No Does patient usually wear dentures?: No  CIWA:    COWS:     Musculoskeletal: Strength & Muscle Tone: within normal limits Gait & Station: normal Patient leans: N/A  Psychiatric Specialty Exam: ROS no shortness of breath, no chest pain, no nausea, no vomiting .  Blood pressure 114/87, pulse 86, temperature 98.6 F (37 C), temperature source Oral, resp. rate 16, height _0  (1.626 m), weight 165 lb (74.844 kg), last menstrual period 01/16/2015, SpO2 95 %, not currently breastfeeding.Body mass index is 28.31 kg/(m^2).  General Appearance: Well Groomed  Engineer, water::  Good  Speech:  Normal Rate  Volume:  Decreased  Mood:  denies feeling depressed, states mood is "OK" today  Affect:  Appropriate and reactive   Thought Process:  Goal Directed and Linear  Orientation:  Full (Time, Place, and Person)  Thought Content:  denies hallucinations, no delusions   Suicidal Thoughts:  No at this time denies any suicidal ideations, denies any self injurious ideations  Homicidal Thoughts:  No  Memory:  recent and remote grossly intact   Judgement:  Other:  improved  Insight:  Present  Psychomotor Activity:  Normal  Concentration:  Good  Recall:  Good  Fund of Knowledge:Good  Language: Good  Akathisia:  Negative  Handed:  Right  AIMS (if indicated):     Assets:  Communication Skills Desire for Improvement Resilience Social Support  ADL's:  Intact  Cognition: WNL  Sleep:  Number of Hours: 6  Assessment - at this time patient is reporting improvement , denying any ongoing self injurious ideations or suicidal ideations, and not endorsing significant depression or neuro-vegetative symptoms of  depression. States she  Regrets having cut herself and denies any ongoing thoughts of self harm. Tolerating Celexa trial well thus far.  Treatment Plan Summary: Daily contact with patient to assess and evaluate symptoms and progress in treatment, Medication management, Plan inpatient admission and medications as below  Encourage ongoing milieu /group participation to work on coping skills and symptom reduction. Treatment team working on disposition options, planning. Continue Celexa 10 mgrs QDAY - we discussed possible dose increase , but patient prefers to stay on current dose at this time Continue Trazodone 50 mgrs QHS PRN for insomnia as needed Will order routine TSH .  COBOS, FERNANDO 02/14/2015, 12:35 PM

## 2015-02-15 DIAGNOSIS — F321 Major depressive disorder, single episode, moderate: Secondary | ICD-10-CM | POA: Diagnosis not present

## 2015-02-15 LAB — TSH: TSH: 2.154 u[IU]/mL (ref 0.350–4.500)

## 2015-02-15 MED ORDER — CITALOPRAM HYDROBROMIDE 20 MG PO TABS
20.0000 mg | ORAL_TABLET | Freq: Every day | ORAL | Status: DC
  Administered 2015-02-16: 20 mg via ORAL
  Filled 2015-02-15 (×3): qty 1
  Filled 2015-02-15: qty 7

## 2015-02-15 NOTE — [facility] Group Notes (Signed)

## 2015-02-15 NOTE — Plan of Care (Signed)
Problem: Ineffective individual coping Goal: STG: Patient will remain free from self harm Outcome: Progressing Pt safe on the unit     

## 2015-02-15 NOTE — Progress Notes (Signed)
Patient has been isolative to room this shift.  Patient has minimal interaction with staff and peers.  Patient was compliant with medications.  Patient remains depressed and withdrawn.  Patient was offered medication and monitored for safety.  Patient had no reports of SI, HI or AVH.  Patient was able to verbally contract for safety.

## 2015-02-15 NOTE — [facility] Group Notes (Signed)
[facility] LCSW Group Therapy 02/15/2015 1:15 PM  Type of Therapy: Group Therapy- Feelings about Diagnosis  Participation Level: Minimal  Participation Quality:  Reserved but Attentive  Affect:  Flat  Cognitive: Alert and Oriented   Insight:  Developing   Engagement in Therapy: Limited   Modes of Intervention: Clarification, Confrontation, Discussion, Education, Exploration, Limit-setting, Orientation, Problem-solving, Rapport Building, Dance movement psychotherapisteality Testing, Socialization and Support  Description of Group:   This group will allow patients to explore their thoughts and feelings about diagnoses they have received. Patients will be guided to explore their level of understanding and acceptance of these diagnoses. Facilitator will encourage patients to process their thoughts and feelings about the reactions of others to their diagnosis, and will guide patients in identifying ways to discuss their diagnosis with significant others in their lives. This group will be process-oriented, with patients participating in exploration of their own experiences as well as giving and receiving support and challenge from other group members.  Summary of Progress/Problems:  Pt did not participate in group discussion but was observed to be attentive to comments of others.   Therapeutic Modalities:   Cognitive Behavioral Therapy Solution Focused Therapy Motivational Interviewing Relapse Prevention Therapy  Chad CordialLauren Carter, LCSWA 02/15/2015 3:37 PM

## 2015-02-15 NOTE — [facility] Group Notes (Signed)
Adult Psychoeducational Group Note  Date:  02/15/2015 Time:  8:51 PM  Group Topic/Focus:  Wrap-Up Group:   The focus of this group is to help patients review their daily goal of treatment and discuss progress on daily workbooks.  Participation Level:  Active  Participation Quality:  Appropriate and Attentive  Affect:  Appropriate  Cognitive:  Alert and Appropriate  Insight: Appropriate  Engagement in Group:  Engaged  Modes of Intervention:  Discussion  Additional Comments:  Pt stated she had a good day.  Shelly BombardGarner, Emojean Gertz D 02/15/2015, 8:51 PM

## 2015-02-15 NOTE — Progress Notes (Signed)
D:Patient in the dayroom on approach.  Patient anxious affect and pleasant mood.  Patient states her goal for today was to be discharged.  Patient states she did not meet her goal today because she is still here.  Patient rates depression 0/10 and anxiety 0/10. Patient denies SI/HI and denies AVH.   A: Staff to monitor Q 15 mins for safety.  Encouragement and support offered.  No scheduled medications administered per orders. R: Patient remains safe on the unit.  Patient attended group tonight.  Patient visible on the unit and interacting with peers.  No medications administered tonight.

## 2015-02-15 NOTE — Progress Notes (Signed)
D: Pt denies SI/HI/AV. Pt is pleasant and cooperative. Pt forwards little information, but stated she had a good day. [facility]  A: Pt was offered support and encouragement. Pt was given scheduled medications. Pt was encourage to attend groups. Q 15 minute checks were done for safety.   R:Pt attends groups and interacts well with peers and staff.  Pt has no complaints.Pt receptive to treatment and safety maintained on unit.

## 2015-02-15 NOTE — Progress Notes (Signed)
Recreation Therapy Notes  Animal-Assisted Activity (AAA) Program Checklist/Progress Notes Patient Eligibility Criteria Checklist & Daily Group note for Rec Tx Intervention  Date: 11.01.2016 Time: 2:15pm [address]   AAA/T Program Assumption of Risk Form signed by Patient/ or Parent Legal Guardian yes  Patient is free of allergies or sever asthma yes  Patient reports no fear of animals yes  Patient reports no history of cruelty to animals yes  Patient understands his/her participation is voluntary yes  Behavioral Response: Did not attend.   Marykay Lexenise L Alyssamae Klinck, LRT/CTRS        Jearl KlinefelterBlanchfield, Caid Radin L 02/15/2015 2:33 PM

## 2015-02-15 NOTE — Progress Notes (Signed)
Patient ID: Brandy Kaufman, female   DOB: 07/30/1990, 24 y.o.   MRN: [id number] [facility] MD Progress Note  02/15/2015 12:45 PM Brandy Kaufman  MRN:  [id number] Subjective:   Patient reports her mood is improving, feels less depressed,less sad .  Denies medication side effects. Objective : I have discussed case with treatment team and have met with patient . Patient reports she is feeling better. States she feels less depressed, denies any ongoing suicidal ideations, and is hoping to be discharged soon, states she misses her children and husband . At this time she is future oriented, and plans to seek a job upon return home, although states that being in new home, new city, and transportation challenges are likely to be barriers to be able to work. She has been visible on unit, has been going to groups , behavior in good control.  Staff has reported patient has been presenting with some subdued , flat affect, but improving compared to admission. No medication side effects reported.  No agitated or disruptive behaviors on unit .  TSH WNL.  Principal Problem: MDD (major depressive disorder) ([facility]) Diagnosis:   Patient Active Problem List   Diagnosis Date Noted  . MDD (major depressive disorder) ([facility]) [F32.9] 02/12/2015  . Postpartum depression [F53] 01/27/2014  . VBAC, delivered, current hospitalization [O34.219] 12/13/2013  . 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 Abnormal Pap smear of cervix [R87.619] 06/08/2013  . Vaginal septum affecting pregnancy [O34.60] 06/08/2013  . Previous cesarean section [Z98.891] 06/01/2013   Total Time spent with patient: 20 minutes   Past Medical History:  Past Medical History  Diagnosis Date  . Medical history non-contributory     Past Surgical History  Procedure Laterality Date  . Cesarean section     Family History:  Family History  Problem Relation Age of Onset  . Diabetes Father   . Diabetes Paternal Grandmother     Social History:   History  Alcohol Use  . Yes     History  Drug Use No    Social History   Social History  . Marital Status: Single    Spouse Name: N/A  . Number of Children: N/A  . Years of Education: N/A   Social History Main Topics  . Smoking status: Never Smoker   . Smokeless tobacco: Never Used  . Alcohol Use: Yes  . Drug Use: No  . Sexual Activity: Yes    Birth Control/ Protection: None   Other Topics Concern  . None   Social History Narrative   Additional Social History:    Pain Medications: pt denies abuse Prescriptions: pt denies abuse Over the Counter: pt denies abuse  Sleep: Good  Appetite:  Good  Current Medications: Current Facility-Administered Medications  Medication Dose Route Frequency Provider Last Rate Last Dose  . acetaminophen (TYLENOL) tablet 650 mg  650 mg Oral Q6H PRN Sheila Agustin, NP      . alum & mag hydroxide-simeth (MAALOX/MYLANTA) 200-200-20 MG/5ML suspension 30 mL  30 mL Oral Q4H PRN Sheila Agustin, NP      . citalopram (CELEXA) tablet 10 mg  10 mg Oral Daily Nadeem Akhtar, MD   10 mg at 02/15/15 0818  . magnesium hydroxide (MILK OF MAGNESIA) suspension 30 mL  30 mL Oral Daily PRN Sheila Agustin, NP      . traZODone (DESYREL) tablet 50 mg  50 mg Oral QHS PRN Sheila Agustin, NP        Lab Results:  Results for orders   placed or performed during the hospital encounter of 02/12/15 (from the past 48 hour(s))  TSH     Status: None   Collection Time: 02/15/15  6:25 AM  Result Value Ref Range   TSH 2.154 0.350 - 4.500 uIU/mL    Comment: Performed at [facility]    Physical Findings: AIMS: Facial and Oral Movements Muscles of Facial Expression: None, normal Lips and Perioral Area: None, normal Jaw: None, normal Tongue: None, normal,Extremity Movements Upper (arms, wrists, hands, fingers): None, normal Lower (legs, knees, ankles, toes): None, normal, Trunk Movements Neck, shoulders, hips: None, normal, Overall  Severity Severity of abnormal movements (highest score from questions above): None, normal Incapacitation due to abnormal movements: None, normal Patient's awareness of abnormal movements (rate only patient's report): No Awareness, Dental Status Current problems with teeth and/or dentures?: No Does patient usually wear dentures?: No  CIWA:    COWS:     Musculoskeletal: Strength & Muscle Tone: within normal limits Gait & Station: normal Patient leans: N/A  Psychiatric Specialty Exam: ROS no shortness of breath, no chest pain, no nausea, no vomiting .  Blood pressure 106/63, pulse 75, temperature 98 F (36.7 C), temperature source Oral, resp. rate 16, height 5' 4" (1.626 m), weight 165 lb (74.844 kg), last menstrual period 01/16/2015, SpO2 95 %, not currently breastfeeding.Body mass index is 28.31 kg/(m^2).  General Appearance: Well Groomed  Eye Contact::  Good  Speech:  Normal Rate  Volume:  Decreased  Mood:  reports improved mood   Affect:   Mildly constricted, but reactive   Thought Process:  Goal Directed and Linear  Orientation:  Full (Time, Place, and Person)  Thought Content:  denies hallucinations, no delusions   Suicidal Thoughts:  No at this time denies any suicidal ideations, denies any self injurious ideations  Homicidal Thoughts:  No  Memory:  recent and remote grossly intact   Judgement:  Other:  improved  Insight:  Present  Psychomotor Activity:  Normal  Concentration:  Good  Recall:  Good  Fund of Knowledge:Good  Language: Good  Akathisia:  Negative  Handed:  Right  AIMS (if indicated):     Assets:  Communication Skills Desire for Improvement Resilience Social Support  ADL's:  Intact  Cognition: WNL  Sleep:  Number of Hours: 6.5  Assessment - at this time patient is reporting improved mood, affect is vaguely constricted, but reactive, and she is hoping for discharge soon, to reunite with her children, husband. At this time tolerating medications well,  denies side effects. Denies any SI, and is future oriented  Treatment Plan Summary: Daily contact with patient to assess and evaluate symptoms and progress in treatment, Medication management, Plan inpatient admission and medications as below  Encourage ongoing milieu /group participation to work on coping skills and symptom reduction. Treatment team working on disposition options, planning. Increase Celexa to 20  mgrs QDAY  Continue Trazodone 50 mgrs QHS PRN for insomnia as needed Consider discharge soon as she continues to improve , stabilize. ,  02/15/2015, 12:45 PM  

## 2015-02-15 NOTE — Tx Team (Signed)
Interdisciplinary Treatment Plan Update (Adult) Date: 02/15/2015   Date: 02/15/2015 1:22 PM  Progress in Treatment:  Attending groups: Yes  Participating in groups: No Taking medication as prescribed: Yes  Tolerating medication: Yes  Family/Significant othe contact made: Yes, with husband Patient understands diagnosis: continuing to assess Discussing patient identified problems/goals with staff: Yes  Medical problems stabilized or resolved: Yes  Denies suicidal/homicidal ideation: Yes Patient has not harmed self or Others: Yes   New problem(s) identified: None identified at this time.   Discharge Plan or Barriers: Pt will return home and follow-up with [facility]  Additional comments: n/a   Reason for Continuation of Hospitalization:  Depression Medication stabilization Suicidal ideation  Estimated length of stay: 1-2 days  Review of initial/current patient goals per problem list:   1.  Goal(s): Patient will participate in aftercare plan  Met:  Yes  Target date: 3-5 days from date of admission   As evidenced by: Patient will participate within aftercare plan AEB aftercare provider and housing plan at discharge being identified.   02/15/15: Pt will return home and follow-up with [facility].  2.  Goal (s): Patient will exhibit decreased depressive symptoms and suicidal ideations.  Met:  Yes  Target date: 3-5 days from date of admission   As evidenced by: Patient will utilize self rating of depression at 3 or below and demonstrate decreased signs of depression or be deemed stable for discharge by MD.  02/15/15:  Pt rating depression at 3/10; improving mood.  3.  Goal(s): Patient will demonstrate decreased signs and symptoms of anxiety.  Met:  Yes  Target date: 3-5 days from date of admission   As evidenced by: Patient will utilize self rating of anxiety at 3 or below and demonstrated decreased signs of anxiety, or be deemed stable for discharge by MD  02/15/15: Pt rates  anxiety at 0/10.  Attendees:  Patient:    Family:    Physician: Dr. Parke Poisson, MD  02/15/2015 1:22 PM  Nursing: Lars Pinks, RN Case manager  02/15/2015 1:22 PM  Clinical Social Worker Peri Maris, Latanya Presser, MSW 02/15/2015 1:22 PM  Other: Lucinda Dell, Beverly Sessions Liasion 02/15/2015 1:22 PM  Clinical:  Grayland Ormond RN 02/15/2015 1:22 PM  Other: , RN Charge Nurse 02/15/2015 1:22 PM  Other:     Peri Maris, [place] MSW

## 2015-02-16 DIAGNOSIS — F321 Major depressive disorder, single episode, moderate: Secondary | ICD-10-CM | POA: Diagnosis not present

## 2015-02-16 MED ORDER — TRAZODONE HCL 50 MG PO TABS: 50.0000 mg | ORAL_TABLET | Freq: Every evening | ORAL | Status: DC | PRN

## 2015-02-16 MED ORDER — CITALOPRAM HYDROBROMIDE 20 MG PO TABS: 20.0000 mg | ORAL_TABLET | Freq: Every day | ORAL | Status: DC

## 2015-02-16 NOTE — Discharge Summary (Signed)
Physician Discharge Summary Note  Patient:  Brandy Kaufman is an 24 y.o., female MRN:  [id number] DOB:  [birth year] Patient phone:  [phone] (home)  Patient address:   [address] [place] [address],  Total Time spent with patient: 45 minutes  Date of Admission:  02/12/2015 Date of Discharge: 02/16/2015  Reason for Admission:   History of Present Illness: Brandy Kaufman is an 24 y.o. female. Pt presents voluntarily to [facility] family at urging of police. Pt is cooperative and oriented x 4. Her affect is euthymic and she reports mildly anxious mood. Pt appears to downplay the seriousness of her cutting her wrists last night. Pt reports she tried to cut her wrists last night with a razor. She reports she was immediately regretful after cutting wrists. She sts she was thinking that she couldn't find a job. She reports when she cut her wrists she was "really anxious, nervous, upset." Pt reports anxiety and sadness b/c she can't find a job in [place]. She reports she and her two daughers (24 yo and 13 mos) moved out of her parents house in [place] and moved to [place] three weeks ago. She reports she moved to be closer to 13 mo old's father. She denies hx of suicide attempts. Pt denies hx of MH treatment and denies substance use. She reports she wasn't able to answer phone early this am when parents called. She says parents called police who accompanied parents to pt's home. Pt denies hx of abuse.   Today patient was seen. She verified above information that was written by TTS counselor. She states that she had been impulsive. She is looking for a job and had been unsuccessful. This caused her sad mood to worsen. However, she states that she loves her children too much to hurt her self. She states that she has 2 supportive parents that help her with childcare. She is denying HI and AVH. She is goal oriented and looks forward to being discharged  Principal  Problem: MDD (major depressive disorder) [facility]) Discharge Diagnoses: Patient Active Problem List   Diagnosis Date Noted  . MDD (major depressive disorder) (HCC) [F32.9] 02/12/2015    Priority: High  . Postpartum depression [F53] 01/27/2014  . VBAC, delivered, current hospitalization [O34.219] 12/13/2013  . 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 Abnormal Pap smear of cervix [R87.619] 06/08/2013  . Vaginal septum affecting pregnancy [O34.60] 06/08/2013  . Previous cesarean section [Z98.891] 06/01/2013    Musculoskeletal: Strength & Muscle Tone: within normal limits Gait & Station: normal Patient leans: N/A  Psychiatric Specialty Exam: Physical Exam  Review of Systems  Psychiatric/Behavioral: Positive for depression. Negative for suicidal ideas, hallucinations and substance abuse. The patient is nervous/anxious and has insomnia.   All other systems reviewed and are negative.   Blood pressure 110/64, pulse 80, temperature 98 F (36.7 C), temperature source Oral, resp. rate 18, height  (1.626 m), weight 74.844 kg (165 lb), last menstrual period 01/16/2015, SpO2 95 %, not currently breastfeeding.Body mass index is 28.31 kg/(m^2).  SEE MD PSE within the SRA   Have you used any form of tobacco in the last 30 days? (Cigarettes, Smokeless Tobacco, Cigars, and/or Pipes): No  Has this patient used any form of tobacco in the last 30 days? (Cigarettes, Smokeless Tobacco, Cigars, and/or Pipes) No  Past Medical History:  Past Medical History  Diagnosis Date  . Medical history non-contributory     Past Surgical History  Procedure Laterality Date  .  Cesarean section     Family History:  Family History  Problem Relation Age of Onset  . Diabetes Father   . Diabetes Paternal Grandmother    Social History:  History  Alcohol Use  . Yes     History  Drug Use No    Social History   Social History  . Marital Status: Single    Spouse Name: N/A  . Number of Children: N/A  . Years of  Education: N/A   Social History Main Topics  . Smoking status: Never Smoker   . Smokeless tobacco: Never Used  . Alcohol Use: Yes  . Drug Use: No  . Sexual Activity: Yes    Birth Control/ Protection: None   Other Topics Concern  . None   Social History Narrative   Risk to Self: What has been your use of drugs/alcohol within the last 12 months?: pt denies Risk to Others:   Prior Inpatient Therapy:   Prior Outpatient Therapy:    Level of Care:  OP  Hospital Course:   Brandy Kaufman was admitted for MDD (major depressive disorder) (HCC) , with psychosis and crisis management.  Pt was treated discharged with the medications listed below under Medication List.  Medical problems were identified and treated as needed.  Home medications were restarted as appropriate.  Improvement was monitored by observation and Brandy Ngoulce M Kaufman 's daily report of symptom reduction.  Emotional and mental status was monitored by daily self-inventory reports completed by Brandy Kaufman and clinical staff.         [facility] evaluated by the treatment team for stability and plans for continued recovery upon discharge. Brandy Kaufman 's motivation was an integral factor for scheduling further treatment. Employment, transportation, bed availability, health status, family support, and any pending legal issues were also considered during hospital stay. Pt was offered further treatment options upon discharge including but not limited to Residential, Intensive Outpatient, and Outpatient treatment.  [facility] will follow up with the services as listed below under Follow Up Information.     Upon completion of this admission the patient was both mentally and medically stable for discharge denying suicidal/homicidal ideation, auditory/visual/tactile hallucinations, delusional thoughts and paranoia.    Consults:  None  Significant Diagnostic  Studies:  UDS negative,   Discharge Vitals:   Blood pressure 110/64, pulse 80, temperature 98 F (36.7 C), temperature source Oral, resp. rate 18, height 5\' 4"  (1.626 m), weight 74.844 kg (165 lb), last menstrual period 01/16/2015, SpO2 95 %, not currently breastfeeding. Body mass index is 28.31 kg/(m^2). Lab Results:   Results for orders placed or performed during the hospital encounter of 02/12/15 (from the past 72 hour(s))  TSH     Status: None   Collection Time: 02/15/15  6:25 AM  Result Value Ref Range   TSH 2.154 0.350 - 4.500 uIU/mL    Comment: [facility]    Physical Findings: AIMS: Facial and Oral Movements Muscles of Facial Expression: None, normal Lips and Perioral Area: None, normal Jaw: None, normal Tongue: None, normal,Extremity Movements Upper (arms, wrists, hands, fingers): None, normal Lower (legs, knees, ankles, toes): None, normal, Trunk Movements Neck, shoulders, hips: None, normal, Overall Severity Severity of abnormal movements (highest score from questions above): None, normal Incapacitation due to abnormal movements: None, normal Patient's awareness of abnormal movements (rate only patient's report): No Awareness, Dental Status Current problems with teeth and/or dentures?: No Does patient usually wear  dentures?: No  CIWA:    COWS:      See Psychiatric Specialty Exam and Suicide Risk Assessment completed by Attending Physician prior to discharge.  Discharge destination:  Home  Is patient on multiple antipsychotic therapies at discharge:  No   Has Patient had three or more failed trials of antipsychotic monotherapy by history:  No    Recommended Plan for Multiple Antipsychotic Therapies: NA     Medication List    TAKE these medications      Indication   citalopram 20 MG tablet  Commonly known as:  CELEXA  Take 1 tablet (20 mg total) by mouth daily.   Indication:  Depression     traZODone 50 MG tablet   Commonly known as:  DESYREL  Take 1 tablet (50 mg total) by mouth at bedtime as needed for sleep.   Indication:  Trouble Sleeping           Follow-up Information    Follow up with [facility].   Why:  Please walk-in within 7 days of discharge between 8am-3pm Monday-Friday for your initial appointment. It is suggested that you arrive early to avoid long wait times and inform staff that you are in need of a hospital discharge appointment.   [address] [place] [address] Phone: [phone] Fax: [phone]      Follow-up recommendations:  Activity:  As tolerated Diet:  Heart healthy with low sodium.  Comments:   Take all medications as prescribed. Keep all follow-up appointments as scheduled.  Do not consume alcohol or use illegal drugs while on prescription medications. Report any adverse effects from your medications to your primary care provider promptly.  In the event of recurrent symptoms or worsening symptoms, call 911, a crisis hotline, or go to the nearest emergency department for evaluation.   Total Discharge Time: Greater than 30 minutes  Signed: Beau Fanny, FNP-BC 02/16/2015, 10:14 AM   Patient seen, Suicide Assessment Completed.  Disposition Plan Reviewed

## 2015-02-16 NOTE — BHH Group Notes (Signed)
[facility] Note  02/16/2015 8:45 AM  Participation Quality: Alert, Appropriate and Oriented  Mood/Affect: Appropriate   Depression Rating: 0  Anxiety Rating: 0  Thoughts of Suicide: Pt denies SI/HI  Will you contract for safety? Yes  Current AVH: Pt denies  Plan for Discharge/Comments: Pt attended discharge planning group and actively participated in group. CSW discussed suicide prevention education with the group and encouraged them to discuss discharge planning and any relevant barriers. Pt reports improved mood from yesterday, describes being ready for discharge. Per Pt, her husband will pick her up from the hospital around 4p today.  Transportation Means: Pt reports access to transportation  Supports: No supports mentioned at this time  Chad CordialLauren Carter, LCSWA 02/16/2015 9:30 AM

## 2015-02-16 NOTE — Progress Notes (Signed)
  [facility] Dis[facility] :  Will you be returning to the same living situation after discharge:  Yes,  Pt will return home At discharge, do you have transportation home?: Yes,  husband to provide transportation Do you have the ability to pay for your medications: Yes,  Pt provided with prescriptions  Release of information consent forms completed and in the chart;  Patient's signature needed at discharge.  Patient to Follow up at: Follow-up Information    Follow up with [facility].   Why:  Please walk-in within 7 days of discharge between 8am-3pm Monday-Friday for your initial appointment. It is suggested that you arrive early to avoid long wait times and inform staff that you are in need of a hospital discharge appointment.   Contact information:   [address] [place] [id number] Phone: [phone] Fax: [phone]      Patient denies SI/HI: Yes,  Pt denies    Safety Planning and Suicide Prevention discussed: Yes,  with husband; see SPE note for further details  Have you used any form of tobacco in the last 30 days? (Cigarettes, Smokeless Tobacco, Cigars, and/or Pipes): No  Has patient been referred to the Quitline?: N/A patient is not a smoker  Elaina HoopsCarter, Seymour Pavlak M 02/16/2015, 9:33 AM

## 2015-02-16 NOTE — BHH Suicide Risk Assessment (Signed)
[facility]   Demographic Factors:  24 year old female, married, has two young children, lives with family, [place]   Total Time spent with patient: 30 minutes  Musculoskeletal: Strength & Muscle Tone: within normal limits Gait & Station: normal Patient leans: N/A  Psychiatric Specialty Exam: Physical Exam  ROS  Blood pressure 110/64, pulse 80, temperature 98 F (36.7 C), temperature source Oral, resp. rate 18, height 5\' 4"  (1.626 m), weight 165 lb (74.844 kg), last menstrual period 01/16/2015, SpO2 95 %, not currently breastfeeding.Body mass index is 28.31 kg/(m^2).  General Appearance: Well Groomed  Patent attorneyye Contact::  Good  Speech:  Normal Rate409  Volume:  Normal  Mood:  Euthymic  Affect:  reactive, fuller in range   Thought Process:  Linear  Orientation:  Full (Time, Place, and Person)  Thought Content:  denies hallucinations, no delusions  Suicidal Thoughts:  No denies any suicidal ideations at this time, denies any self injurious ideations  Homicidal Thoughts:  No  Memory:  recent and remote grossly intact   Judgement:  Other:  improved   Insight:  Good  Psychomotor Activity:  Normal  Concentration:  Good  Recall:  Good  Fund of Knowledge:Good  Language: Good  Akathisia:  Negative  Handed:  Right  AIMS (if indicated):     Assets:  Communication Skills Desire for Improvement Physical Health  Sleep:  Number of Hours: 6.5  Cognition: WNL  ADL's:  Intact   Have you used any form of tobacco in the last 30 days? (Cigarettes, Smokeless Tobacco, Cigars, and/or Pipes): No  Has this patient used any form of tobacco in the last 30 days? (Cigarettes, Smokeless Tobacco, Cigars, and/or Pipes) No  Mental Status Per Nursing Assessment::   On Admission:     Current Mental Status by Physician: At this time she is improved compared to her admission- her mood is improved and currently no longer feeling depressed,  affect is reactive, no thought disorder, no suicidal thoughts, no HI, no psychotic symptoms, and is future oriented   Loss Factors: Recent relocation which meant having less family support locally and losing her job of 6 years   Historical Factors: Denies prior history of suicide attempts, this is first psychiatric admission  Risk Reduction Factors:   Responsible for children under 24 years of age, Sense of responsibility to family, Living with another person, especially a relative, Positive social support and Positive coping skills or problem solving skills  Continued Clinical Symptoms:   at this time improved as noted above   Cognitive Features That Contribute To Risk:  No gross cognitive deficits noted upon discharge. Is alert , attentive, and oriented x 3   Suicide Risk:  Mild:  Suicidal ideation of limited frequency, intensity, duration, and specificity.  There are no identifiable plans, no associated intent, mild dysphoria and related symptoms, good self-control (both objective and subjective assessment), few other risk factors, and identifiable protective factors, including available and accessible social support.  Principal Problem: MDD (major depressive disorder) [facility](HCC) Discharge Diagnoses:  Patient Active Problem List   Diagnosis Date Noted  . MDD (major depressive disorder) (HCC) [F32.9] 02/12/2015  . Postpartum depression [F53] 01/27/2014  . VBAC, delivered, current hospitalization [O34.219] 12/13/2013  . 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 Abnormal Pap smear of cervix [R87.619] 06/08/2013  . Vaginal septum affecting pregnancy [O34.60] 06/08/2013  . Previous cesarean section [B14.782][Z98.891] 06/01/2013    Follow-up Information    Follow up with  [facility].   Why:  Please walk-in within 7 days of discharge between 8am-3pm Monday-Friday for your initial appointment. It is suggested that you arrive early to avoid long wait times and inform staff that you are in need of a hospital  discharge appointment.   Contact information:   [address] [place] [address] Phone: [phone] Fax: [phone]      Plan Of Care/Follow-up recommendations:  Activity:  as tolerated Diet:  regular Tests:  NA Other:  see below   Is patient on multiple antipsychotic therapies at discharge:  No   Has Patient had three or more failed trials of antipsychotic monotherapy by history:  No  Recommended Plan for Multiple Antipsychotic Therapies: NA   Patient is leaving unit in good spirits. Plans to return home. States that husband is going to pick her up later today.    Romulus Hanrahan 02/16/2015, 2:11 PM

## 2015-04-18 ENCOUNTER — Emergency Department
Admission: EM | Admit: 2015-04-18 | Discharge: 2015-04-18 | Disposition: A | Discharge status: Home / Self Care | Payer: Self-pay | Attending: Emergency Medicine | Admitting: Emergency Medicine

## 2015-04-18 ENCOUNTER — Encounter: Payer: Self-pay | Admitting: Emergency Medicine

## 2015-04-18 DIAGNOSIS — F321 Major depressive disorder, single episode, moderate: Secondary | ICD-10-CM

## 2015-04-18 DIAGNOSIS — F4325 Adjustment disorder with mixed disturbance of emotions and conduct: Secondary | ICD-10-CM

## 2015-04-18 DIAGNOSIS — F329 Major depressive disorder, single episode, unspecified: Secondary | ICD-10-CM | POA: Insufficient documentation

## 2015-04-18 DIAGNOSIS — F10929 Alcohol use, unspecified with intoxication, unspecified: Secondary | ICD-10-CM

## 2015-04-18 DIAGNOSIS — Z3202 Encounter for pregnancy test, result negative: Secondary | ICD-10-CM | POA: Insufficient documentation

## 2015-04-18 DIAGNOSIS — F419 Anxiety disorder, unspecified: Secondary | ICD-10-CM | POA: Insufficient documentation

## 2015-04-18 DIAGNOSIS — R451 Restlessness and agitation: Secondary | ICD-10-CM

## 2015-04-18 DIAGNOSIS — F32A Depression, unspecified: Secondary | ICD-10-CM

## 2015-04-18 HISTORY — DX: Depression, unspecified: F32.A

## 2015-04-18 HISTORY — DX: Major depressive disorder, single episode, unspecified: F32.9

## 2015-04-18 LAB — CBC
HCT: 37 % (ref 35.0–47.0)
Hemoglobin: 12.1 g/dL (ref 12.0–16.0)
MCH: 27.2 pg (ref 26.0–34.0)
MCHC: 32.7 g/dL (ref 32.0–36.0)
MCV: 83.3 fL (ref 80.0–100.0)
PLATELETS: 206 10*3/uL (ref 150–440)
RBC: 4.44 MIL/uL (ref 3.80–5.20)
RDW: 13.9 % (ref 11.5–14.5)
WBC: 7 10*3/uL (ref 3.6–11.0)

## 2015-04-18 LAB — URINE DRUG SCREEN, QUALITATIVE (ARMC ONLY)
Amphetamines, Ur Screen: NOT DETECTED
Barbiturates, Ur Screen: NOT DETECTED
Benzodiazepine, Ur Scrn: NOT DETECTED
CANNABINOID 50 NG, UR ~~LOC~~: NOT DETECTED
Cocaine Metabolite,Ur ~~LOC~~: NOT DETECTED
MDMA (ECSTASY) UR SCREEN: NOT DETECTED
Methadone Scn, Ur: NOT DETECTED
OPIATE, UR SCREEN: NOT DETECTED
PHENCYCLIDINE (PCP) UR S: NOT DETECTED
Tricyclic, Ur Screen: NOT DETECTED

## 2015-04-18 LAB — PREGNANCY, URINE: PREG TEST UR: NEGATIVE

## 2015-04-18 LAB — COMPREHENSIVE METABOLIC PANEL
ALT: 11 U/L — AB (ref 14–54)
AST: 15 U/L (ref 15–41)
Albumin: 4.6 g/dL (ref 3.5–5.0)
Alkaline Phosphatase: 49 U/L (ref 38–126)
Anion gap: 8 (ref 5–15)
BILIRUBIN TOTAL: 0.4 mg/dL (ref 0.3–1.2)
BUN: 6 mg/dL (ref 6–20)
CO2: 25 mmol/L (ref 22–32)
CREATININE: 0.48 mg/dL (ref 0.44–1.00)
Calcium: 8.9 mg/dL (ref 8.9–10.3)
Chloride: 111 mmol/L (ref 101–111)
GFR calc Af Amer: >60 mL/min (ref >60)
Glucose, Bld: 101 mg/dL — ABNORMAL HIGH (ref 65–99)
Potassium: 4.1 mmol/L (ref 3.5–5.1)
Sodium: 144 mmol/L (ref 135–145)
TOTAL PROTEIN: 7.7 g/dL (ref 6.5–8.1)

## 2015-04-18 LAB — ETHANOL: ALCOHOL ETHYL (B): 111 mg/dL — AB (ref <5)

## 2015-04-18 LAB — SALICYLATE LEVEL: Salicylate Lvl: <4 mg/dL (ref 2.8–30.0)

## 2015-04-18 LAB — ACETAMINOPHEN LEVEL: Acetaminophen (Tylenol), Serum: <10 ug/mL — ABNORMAL LOW (ref 10–30)

## 2015-04-18 MED ORDER — CITALOPRAM HYDROBROMIDE 20 MG PO TABS: 20.0000 mg | ORAL_TABLET | Freq: Every day | ORAL | Status: DC

## 2015-04-18 NOTE — ED Notes (Signed)
Patient resting quietly in room. No noted distress or abnormal behaviors noted. Will continue 15 minute checks and observation by security camera for safety. 

## 2015-04-18 NOTE — ED Notes (Signed)
ED BHU PLACEMENT JUSTIFICATION Is the patient under IVC or is there intent for IVC: Yes.   Is the patient medically cleared: Yes.   Is there vacancy in the ED BHU: Yes.   Is the population mix appropriate for patient: Yes.   Is the patient awaiting placement in inpatient or outpatient setting: Yes.   Has the patient had a psychiatric consult: Yes.   Survey of unit performed for contraband, proper placement and condition of furniture, tampering with fixtures in bathroom, shower, and each patient room: Yes.  ; Findings: All clear. APPEARANCE/BEHAVIOR calm, cooperative and adequate rapport can be established NEURO ASSESSMENT Orientation: time, place and person Hallucinations: No.None noted (Hallucinations) Speech: Normal Gait: normal RESPIRATORY ASSESSMENT Within normal limits. CARDIOVASCULAR ASSESSMENT Within normal limits GASTROINTESTINAL ASSESSMENT Within normal limits EXTREMITIES ROM of all joints is normal PLAN OF CARE Provide calm/safe environment. Vital signs assessed twice daily. ED BHU Assessment once each 12-hour shift. Collaborate with intake RN daily or as condition indicates. Assure the ED provider has rounded once each shift. Provide and encourage hygiene. Provide redirection as needed. Assess for escalating behavior; address immediately and inform ED provider.  Assess family dynamic and appropriateness for visitation as needed: Yes.  ; If necessary, describe findings:  Educate the patient/family about BHU procedures/visitation: Yes.  ; If necessary, describe findings: Patient calm and cooperative at this time, understanding of BHU rules and procedures.  Will continue to monitor. .bhu

## 2015-04-18 NOTE — ED Notes (Signed)
Pt soft spoken, not too talkative. Pt denies SI/HI and AVH. Denies pain. No complaints/concerns. No distress noted. Maintained on 15 minute checks and observation by security camera for safety.

## 2015-04-18 NOTE — ED Notes (Addendum)

## 2015-04-18 NOTE — ED Provider Notes (Signed)
[facility] Emergency Department Provider Note  ____________________________________________  Time seen: Approximately 255 AM  I have reviewed the triage vital signs and the nursing notes.   HISTORY  Chief Complaint Mental Health Problem    HPI Brandy Kaufman is a 25 y.o. female who comes into the hospital today under involuntary commitment. The patient reports that she felt upset and sad but she denies thoughts of suicide. She reports that she has tried to cut her veins in the past and did have to spend a week and behavioral health but also denies at that time that she was trying to kill herself. The patient has been feeling depressed and anxious. The patient does not take any medicines. The patient also was drinking tonight. The patient has been having problems with the father of her child which is what has led to her symptoms tonight. The patient's family was concerned that she would try to kill herself like she has done before so they did involuntarily commit her.   Past Medical History  Diagnosis Date  . Depression     There are no active problems to display for this patient.   No past surgical history on file.  No current outpatient prescriptions on file.  Allergies Review of patient's allergies indicates no known allergies.  No family history on file.  Social History Social History  Substance Use Topics  . Smoking status: Never Smoker   . Smokeless tobacco: Not on file  . Alcohol Use: Yes    Review of Systems Constitutional: No fever/chills Eyes: No visual changes. ENT: No sore throat. Cardiovascular: Denies chest pain. Respiratory: Denies shortness of breath. Gastrointestinal: No abdominal pain.  No nausea, no vomiting.  No diarrhea.  No constipation. Genitourinary: Negative for dysuria. Musculoskeletal: Negative for back pain. Skin: Negative for rash. Neurological: Negative for headaches, focal weakness or numbness. Psych:  depression  10-point ROS otherwise negative.  ____________________________________________   PHYSICAL EXAM:  VITAL SIGNS: ED Triage Vitals  Enc Vitals Group     BP 04/18/15 0127 117/83 mmHg     Pulse Rate 04/18/15 0127 84     Resp 04/18/15 0127 20     Temp 04/18/15 0127 98 F (36.7 C)     Temp src --      SpO2 04/18/15 0127 99 %     Weight 04/18/15 0127 170 lb (77.111 kg)     Height 04/18/15 0127 5\' 1"  (1.549 m)     Head Cir --      Peak Flow --      Pain Score --      Pain Loc --      Pain Edu? --      Excl. in GC? --     Constitutional: Alert and oriented. Well appearing and in no acute distress. Eyes: Conjunctivae are normal. PERRL. EOMI. Head: Atraumatic. Nose: No congestion/rhinnorhea. Mouth/Throat: Mucous membranes are moist.  Oropharynx non-erythematous. Cardiovascular: Normal rate, regular rhythm. Grossly normal heart sounds.  Good peripheral circulation. Respiratory: Normal respiratory effort.  No retractions. Lungs CTAB. Gastrointestinal: Soft and nontender. No distention.positive bowel sounds Musculoskeletal: No lower extremity tenderness nor edema.  No joint effusions. Neurologic:  Normal speech and language. No gross focal neurologic deficits are appreciated. No gait instability. Skin:  Skin is warm, dry and intact.  Psychiatric: Mood and affect are normal.   ____________________________________________   LABS (all labs ordered are listed, but only abnormal results are displayed)  Labs Reviewed  COMPREHENSIVE METABOLIC PANEL - Abnormal; Notable  for the following:    Glucose, Bld 101 (*)    ALT 11 (*)    All other components within normal limits  ETHANOL - Abnormal; Notable for the following:    Alcohol, Ethyl (B) 111 (*)    All other components within normal limits  ACETAMINOPHEN LEVEL - Abnormal; Notable for the following:    Acetaminophen (Tylenol), Serum <10 (*)    All other components within normal limits  SALICYLATE LEVEL  CBC  URINE DRUG  SCREEN, QUALITATIVE ([facility] ONLY)  PREGNANCY, URINE   ____________________________________________  EKG  none ____________________________________________  RADIOLOGY  none ____________________________________________   PROCEDURES  Procedure(s) performed: None  Critical Care performed: No  ____________________________________________   INITIAL IMPRESSION / ASSESSMENT AND PLAN / ED COURSE  Pertinent labs & imaging results that were available during my care of the patient were reviewed by me and considered in my medical decision making (see chart for details).  This is a 25 year old female who comes into the hospital today under involuntary commitment with a concern that she may commit suicide due to circumstances involving her relationship. The patient denies any thoughts of suicide and reports that she just feels sad and depressed. I will have the patient evaluated by psych ____________________________________________   FINAL CLINICAL IMPRESSION(S) / ED DIAGNOSES  Final diagnoses:  Depression      Rebecka ApleyAllison P Webster, MD 04/18/15 [phone]

## 2015-04-18 NOTE — ED Notes (Signed)
Pt asking if she will be able to go home today. RN explained to pt that this decision will be made by the psychiatrist. Pt stated she was just feeling sad yesterday, but had no intention of hurting herself or anyone else. Pt calm and cooperative. No complaints/distress. Maintained on 15 minute checks and observation by security camera for safety.

## 2015-04-18 NOTE — BH Assessment (Signed)
Assessment Note  Brandy Kaufman is an 25 y.o. female. Brandy Kaufman reports that she  arrived to the ED by way of a friend. She did in fact arrive by EMS and police under IVC. aShe reports that she was feeling upset and lonely.  She denied feelings of depression or anxiety.  She denied having auditory or visual hallucinations. She denied having suicidal or homicidal ideation or intent.  Brandy Kaufman was not very forthright in the assessment and did not provide much information.  IVC paperwork reports that Brandy Kaufman has a history of suicide attempts.  Law enforcement and EMS respondent found her at her residence crying uncontrollably.  She would not answer or respond to EMS. She became hysterical when EMS attempted to touch her to get vitals. She was crying and screaming.  Family fears that she is contemplating suicide.  Diagnosis:  Past Medical History: No past medical history on file.  No past surgical history on file.  Family History: No family history on file.  Social History:  has no tobacco, alcohol, and drug history on file.  Additional Social History:  Alcohol / Drug Use History of alcohol / drug use?: No history of alcohol / drug abuse  CIWA: CIWA-Ar BP: 117/83 mmHg Pulse Rate: 84 COWS:    Allergies: No Known Allergies  Home Medications:  (Not in a hospital admission)  OB/GYN Status:  Patient's last menstrual period was 03/23/2015 (exact date).  General Assessment Data Location of Assessment: [facility] ED TTS Assessment: In system Is this a Tele or Face-to-Face Assessment?: Face-to-Face Is this an Initial Assessment or a Re-assessment for this encounter?: Initial Assessment Marital status: Single Maiden name: n/a Is patient pregnant?: No Pregnancy Status: No Living Arrangements: Children Can pt return to current living arrangement?: Yes Admission Status: Involuntary Is patient capable of signing voluntary admission?: Yes Referral Source:  Self/Family/Friend Insurance type: self pay  Medical Screening Exam [facility] Walk-in ONLY) Medical Exam completed: Yes  Crisis Care Plan Living Arrangements: Children Legal Guardian: Other: (None) Name of Psychiatrist: Denied  Name of Therapist: Denied  Education Status Is patient currently in school?: No Current Grade: n/a Highest grade of school patient has completed: 10th Name of school: [employer] person: n/a  Risk to self with the past 6 months Suicidal Ideation: No Has patient been a risk to self within the past 6 months prior to admission? : No Suicidal Intent: No Has patient had any suicidal intent within the past 6 months prior to admission? : No Is patient at risk for suicide?: No Suicidal Plan?: No Has patient had any suicidal plan within the past 6 months prior to admission? : No Access to Means: No What has been your use of drugs/alcohol within the last 12 months?: alcohol twice in the last month Previous Attempts/Gestures: No How many times?: 0 Other Self Harm Risks: denied Triggers for Past Attempts: None known Intentional Self Injurious Behavior: None Family Suicide History: No Recent stressful life event(s):  (denied) Persecutory voices/beliefs?: No Depression: No Depression Symptoms:  (denied) Substance abuse history and/or treatment for substance abuse?: No Suicide prevention information given to non-admitted patients: Not applicable  Risk to Others within the past 6 months Homicidal Ideation: No Does patient have any lifetime risk of violence toward others beyond the six months prior to admission? : No Thoughts of Harm to Others: No Current Homicidal Intent: No Current Homicidal Plan: No Access to Homicidal Means: No Identified Victim: None reported History of harm to others?: No Assessment  of Violence: None Noted Violent Behavior Description: denieed Does patient have access to weapons?: No Criminal Charges Pending?: No Does  patient have a court date: No Is patient on probation?: No  Psychosis Hallucinations: None noted Delusions: None noted  Mental Status Report Appearance/Hygiene: In scrubs, Unremarkable Eye Contact: Fair Motor Activity: Unremarkable Speech: Logical/coherent Level of Consciousness: Quiet/awake Mood: Irritable Affect: Irritable Anxiety Level: None Thought Processes: Coherent Judgement: Unimpaired Orientation: Place, Situation Obsessive Compulsive Thoughts/Behaviors: None  Cognitive Functioning Concentration: Normal Memory: Recent Intact IQ: Average Insight: Fair Impulse Control: Fair Appetite: Fair Sleep: No Change Vegetative Symptoms: None  ADLScreening [facility] Assessment Services) Patient's cognitive ability adequate to safely complete daily activities?: Yes Patient able to express need for assistance with ADLs?: Yes Independently performs ADLs?: Yes (appropriate for developmental age)  Prior Inpatient Therapy Prior Inpatient Therapy: No  Prior Outpatient Therapy Prior Outpatient Therapy: No Does patient have an ACCT team?: No Does patient have Intensive In-House Services?  : No Does patient have [facility] services? : No Does patient have P4CC services?: No  ADL Screening (condition at time of admission) Patient's cognitive ability adequate to safely complete daily activities?: Yes Patient able to express need for assistance with ADLs?: Yes Independently performs ADLs?: Yes (appropriate for developmental age)       Abuse/Neglect Assessment (Assessment to be complete while patient is alone) Physical Abuse: Denies Verbal Abuse: Denies Sexual Abuse: Denies Exploitation of patient/patient's resources: Denies Self-Neglect: Denies Values / Beliefs Cultural Requests During Hospitalization: None Spiritual Requests During Hospitalization: None        Additional Information 1:1 In Past 12 Months?: No CIRT Risk: No Elopement Risk: No Does patient have medical  clearance?: Yes     Disposition:     On Site Evaluation by:   Reviewed with Physician:    Justice DeedsKeisha Kensley Lares 04/18/2015 5:03 AM

## 2015-04-18 NOTE — Consult Note (Signed)
[place] Consult   Reason for Consult:  Consult for this 25 year old woman who is brought to the emergency room by law enforcement under IVC because of acute agitation and concern on the part of her family. Referring Physician:  Quale Patient Identification: Brandy Kaufman MRN:  [id number] Principal Diagnosis: Adjustment disorder with mixed disturbance of emotions and conduct Diagnosis:   Patient Active Problem List   Diagnosis Date Noted  . Adjustment disorder with mixed disturbance of emotions and conduct [F43.25] 04/18/2015  . Agitation [R45.1] 04/18/2015  . Depression, major, single episode, moderate ([place]) [F32.1] 04/18/2015  . Alcohol intoxication ([place]) [F10.129] 04/18/2015    Total Time spent with patient: 1 hour  Subjective:   Brandy Kaufman is a 25 y.o. female patient admitted with "I was just upset last night".  HPI:  Patient interviewed. She was forthcoming and a good historian. Chart reviewed. I was able to also find her prior admission under a slight variation in her name and have requested those charts BE merged. Current labs and vitals reviewed. Patient reports that yesterday she had just moved back into her parent's home with HER-2 young daughters. Because it was new years there was beer around and she had a few beers. She got upset and started crying hysterically. Family got very concerned because she couldn't calm down and called law enforcement. Law enforcement report that when they got there the patient was hysterical and they couldn't communicate with her. Family had voiced a concern that she could be suicidal. In interview today the patient admits to all of this but says that she had not had any suicidal thoughts or intent at all. She didn't do anything to herself. She's been under a lot of stress recently. She has 2 young daughters ages 26 and 53 months. She had been living in [place] recently where she was not working but was being supported by a man  who is the father of her younger daughter. He is married and doesn't devote much time to her and she been feeling pretty stressed out by that. Just yesterday decided to move back to be with her parents. Mood stays down much of the time. Gets panicky and anxious intermittently. Some intermittent difficulty sleeping. Appetite is been normal. Patient denies having any ongoing thoughts of wanting to die or wanting to kill her self no homicidal thoughts. Denies any psychotic symptoms. She says she normally does not drink yesterday was unusual denies any drug abuse. She has not been compliant with recommended outpatient mental health care and has not been taking the antidepressant was prescribed for her.  Social history: Patient [place] She says she has a good relationship with him and they are supportive. She has 2 young daughters as noted above. She did not finish high school. She has some work experience but has not recently been working. She has appropriate concerns about all of this.  Medical history: Denies any known significant ongoing medical problems.  Substance abuse history: Patient came in with an alcohol level of 111 and admit she had several beers yesterday. She says that she normally does not drink and it has not been identified as a problem on previous notes. No history of drug use.    Past Psychiatric History: Patient had a previous admission at [facility] behavioral health in November 2016. At that time she had cut her wrists. She was there briefly and denied any ongoing suicidal  thoughts. She was started on citalopram and trazodone at discharge with follow-up to be done at [facility]. She did not pursue any follow-up care. No other history of suicide attempts.  Risk to Self: Suicidal Ideation: No Suicidal Intent: No Is patient at risk for suicide?: No Suicidal Plan?: No Access to Means: No What has been your use of  drugs/alcohol within the last 12 months?: alcohol twice in the last month How many times?: 0 Other Self Harm Risks: denied Triggers for Past Attempts: None known Intentional Self Injurious Behavior: None Risk to Others: Homicidal Ideation: No Thoughts of Harm to Others: No Current Homicidal Intent: No Current Homicidal Plan: No Access to Homicidal Means: No Identified Victim: None reported History of harm to others?: No Assessment of Violence: None Noted Violent Behavior Description: denieed Does patient have access to weapons?: No Criminal Charges Pending?: No Does patient have a court date: No Prior Inpatient Therapy: Prior Inpatient Therapy: No Prior Outpatient Therapy: Prior Outpatient Therapy: No Does patient have an ACCT team?: No Does patient have Intensive In-House Services?  : No Does patient have [facility] services? : No Does patient have P4CC services?: No  Past Medical History:  Past Medical History  Diagnosis Date  . Depression    No past surgical history on file. Family History: No family history on file. Family Psychiatric  History: Patient denies there being any family history of mental health or substance abuse problems Social History:  History  Alcohol Use  . Yes     History  Drug Use Not on file    Social History   Social History  . Marital Status: Single    Spouse Name: N/A  . Number of Children: N/A  . Years of Education: N/A   Social History Main Topics  . Smoking status: Never Smoker   . Smokeless tobacco: Not on file  . Alcohol Use: Yes  . Drug Use: Not on file  . Sexual Activity: Not on file   Other Topics Concern  . Not on file   Social History Narrative  . No narrative on file   Additional Social History:    History of alcohol / drug use?: No history of alcohol / drug abuse                     Allergies:  No Known Allergies  Labs:  Results for orders placed or performed during the hospital encounter of 04/18/15 (from  the past 48 hour(s))  Comprehensive metabolic panel     Status: Abnormal   Collection Time: 04/18/15  1:33 AM  Result Value Ref Range   Sodium 144 135 - 145 mmol/L   Potassium 4.1 3.5 - 5.1 mmol/L   Chloride 111 101 - 111 mmol/L   CO2 25 22 - 32 mmol/L   Glucose, Bld 101 (H) 65 - 99 mg/dL   BUN 6 6 - 20 mg/dL   Creatinine, Ser 0.48 0.44 - 1.00 mg/dL   Calcium 8.9 8.9 - 10.3 mg/dL   Total Protein 7.7 6.5 - 8.1 g/dL   Albumin 4.6 3.5 - 5.0 g/dL   AST 15 15 - 41 U/L   ALT 11 (L) 14 - 54 U/L   Alkaline Phosphatase 49 38 - 126 U/L   Total Bilirubin 0.4 0.3 - 1.2 mg/dL   GFR calc non Af Amer >60 >60 mL/min   GFR calc Af Amer >60 >60 mL/min    Comment: (NOTE) The eGFR has been calculated using the CKD  EPI equation. This calculation has not been validated in all clinical situations. eGFR's persistently <60 mL/min signify possible Chronic Kidney Disease.    Anion gap 8 5 - 15  Ethanol (ETOH)     Status: Abnormal   Collection Time: 04/18/15  1:33 AM  Result Value Ref Range   Alcohol, Ethyl (B) 111 (H) <5 mg/dL    Comment:        LOWEST DETECTABLE LIMIT FOR SERUM ALCOHOL IS 5 mg/dL FOR MEDICAL PURPOSES ONLY   Salicylate level     Status: None   Collection Time: 04/18/15  1:33 AM  Result Value Ref Range   Salicylate Lvl <2.5 2.8 - 30.0 mg/dL  Acetaminophen level     Status: Abnormal   Collection Time: 04/18/15  1:33 AM  Result Value Ref Range   Acetaminophen (Tylenol), Serum <10 (L) 10 - 30 ug/mL    Comment:        THERAPEUTIC CONCENTRATIONS VARY SIGNIFICANTLY. A RANGE OF 10-30 ug/mL MAY BE AN EFFECTIVE CONCENTRATION FOR MANY PATIENTS. HOWEVER, SOME ARE BEST TREATED AT CONCENTRATIONS OUTSIDE THIS RANGE. ACETAMINOPHEN CONCENTRATIONS >150 ug/mL AT 4 HOURS AFTER INGESTION AND >50 ug/mL AT 12 HOURS AFTER INGESTION ARE OFTEN ASSOCIATED WITH TOXIC REACTIONS.   CBC     Status: None   Collection Time: 04/18/15  1:33 AM  Result Value Ref Range   WBC 7.0 3.6 - 11.0 K/uL   RBC  4.44 3.80 - 5.20 MIL/uL   Hemoglobin 12.1 12.0 - 16.0 g/dL   HCT 37.0 35.0 - 47.0 %   MCV 83.3 80.0 - 100.0 fL   MCH 27.2 26.0 - 34.0 pg   MCHC 32.7 32.0 - 36.0 g/dL   RDW 13.9 11.5 - 14.5 %   Platelets 206 150 - 440 K/uL  Urine Drug Screen, Qualitative ([facility] only)     Status: None   Collection Time: 04/18/15  1:33 AM  Result Value Ref Range   Tricyclic, Ur Screen NONE DETECTED NONE DETECTED   Amphetamines, Ur Screen NONE DETECTED NONE DETECTED   MDMA (Ecstasy)Ur Screen NONE DETECTED NONE DETECTED   Cocaine Metabolite,Ur [place] NONE DETECTED NONE DETECTED   Opiate, Ur Screen NONE DETECTED NONE DETECTED   Phencyclidine (PCP) Ur S NONE DETECTED NONE DETECTED   Cannabinoid 50 Ng, Ur [place] NONE DETECTED NONE DETECTED   Barbiturates, Ur Screen NONE DETECTED NONE DETECTED   Benzodiazepine, Ur Scrn NONE DETECTED NONE DETECTED   Methadone Scn, Ur NONE DETECTED NONE DETECTED    Comment: (NOTE) 638  Tricyclics, urine               Cutoff 1000 ng/mL 200  Amphetamines, urine             Cutoff 1000 ng/mL 300  MDMA (Ecstasy), urine           Cutoff 500 ng/mL 400  Cocaine Metabolite, urine       Cutoff 300 ng/mL 500  Opiate, urine                   Cutoff 300 ng/mL 600  Phencyclidine (PCP), urine      Cutoff 25 ng/mL 700  Cannabinoid, urine              Cutoff 50 ng/mL 800  Barbiturates, urine             Cutoff 200 ng/mL 900  Benzodiazepine, urine           Cutoff 200 ng/mL 1000 Methadone, urine  Cutoff 300 ng/mL 1100 1200 The urine drug screen provides only a preliminary, unconfirmed 1300 analytical test result and should not be used for non-medical 1400 purposes. Clinical consideration and professional judgment should 1500 be applied to any positive drug screen result due to possible 1600 interfering substances. A more specific alternate chemical method 1700 must be used in order to obtain a confirmed analytical result.  1800 Gas chromato graphy / mass spectrometry (GC/MS) is the  preferred 1900 confirmatory method.   Pregnancy, urine     Status: None   Collection Time: 04/18/15  1:33 AM  Result Value Ref Range   Preg Test, Ur NEGATIVE NEGATIVE    No current facility-administered medications for this encounter.   Current Outpatient Prescriptions  Medication Sig Dispense Refill  . citalopram (CELEXA) 20 MG tablet Take 1 tablet (20 mg total) by mouth daily. 30 tablet 0    Musculoskeletal: Strength & Muscle Tone: within normal limits Gait & Station: normal Patient leans: N/A  Psychiatric Specialty Exam: Review of Systems  Constitutional: Negative.   HENT: Negative.   Eyes: Negative.   Respiratory: Negative.   Cardiovascular: Negative.   Gastrointestinal: Negative.   Musculoskeletal: Negative.   Skin: Negative.   Neurological: Negative.   Psychiatric/Behavioral: Negative for depression, suicidal ideas, hallucinations, memory loss and substance abuse. The patient is nervous/anxious and has insomnia.     Blood pressure 127/73, pulse 69, temperature 98.2 F (36.8 C), temperature source Oral, resp. rate 18, height 5' 1"  (1.549 m), weight 77.111 kg (170 lb), last menstrual period 03/23/2015, SpO2 98 %.Body mass index is 32.14 kg/(m^2).  General Appearance: Fairly Groomed  Engineer, water::  Minimal  Speech:  Clear and Coherent  Volume:  Normal  Mood:  Dysphoric  Affect:  Congruent  Thought Process:  Goal Directed  Orientation:  Full (Time, Place, and Person)  Thought Content:  Negative  Suicidal Thoughts:  No  Homicidal Thoughts:  No  Memory:  Immediate;   Good Recent;   Good Remote;   Good  Judgement:  Intact  Insight:  Fair  Psychomotor Activity:  Normal  Concentration:  Good  Recall:  Good  Fund of Knowledge:Good  Language: Good  Akathisia:  No  Handed:  Right  AIMS (if indicated):     Assets:  Communication Skills Desire for Improvement Housing Physical Health Resilience Social Support  ADL's:  Intact  Cognition: WNL  Sleep:       Treatment Plan Summary: Medication management and Plan 25 year old woman who had an acute adjustment disorder and also had alcohol intoxication last night. Both of those problems appear to be acutely resolved at this point. There doesn't appear to be clear evidence that she has an ongoing alcohol problem. Patient was diagnosed previously with major depression and probably continues to have symptoms of a moderate major depression. She does not meet commitment criteria however and does not require inpatient hospital level treatment. Patient will be taken off of her involuntary commitment. I have rewritten a new prescription for citalopram 20 mg per day and recommended that she restart her medicine. She will be referred to outpatient mental health care and [facility] as that is where she intends to be living at this time. Supportive counseling about care and treatment of her health in general done. Patient is strongly encouraged to completely avoid alcohol for the time being given the incidence of last night and she agrees to this. Case reviewed with emergency room doctor.  Disposition: Patient does not meet criteria for  psychiatric inpatient admission. Supportive therapy provided about ongoing stressors. Discussed crisis plan, support from social network, calling 911, coming to the Emergency Department, and calling Suicide Hotline.  Graig Hessling 04/18/2015 12:54 PM

## 2015-04-18 NOTE — ED Notes (Signed)
Patient to ED-[facility] from ED ambulatory without difficulty to room #2.  Patient is alert and oriented, calm and cooperative and in no apparent distress currently.  Patient denies any pain at the present time.  Patient is oriented to the unit.  Patient denies suicidal ideation, homicidal ideation, auditory or visual hallucinations currently.  Patient is made aware of security cameras and q.15 minute safety checks.  Patient is encouraged to notify staff with any questions or concerns.

## 2015-04-18 NOTE — ED Notes (Signed)
Patient ambulatory to triage with steady gait, without difficulty or distress noted, brought in by [employer] deputy for IVC; papers indicate pt crying & family fears that she is contemplating suicide; hx of attempt; pt admits to depression but denies SI

## 2015-04-18 NOTE — ED Notes (Addendum)
Pt will be discharged home. Pt has called her ride. Pt has been given/reviewed discharge instructions. Maintained on 15 minute checks and observation by security camera for safety. Pt denies SI/HI, AVH and pain.

## 2015-04-18 NOTE — ED Provider Notes (Signed)
Patient seen and cleared for discharge by Dr. Toni Amendlapacs.    Sharyn CreamerMark Quale, MD 04/18/15 1335

## 2015-04-18 NOTE — ED Notes (Signed)
BEHAVIORAL HEALTH ROUNDING  Patient sleeping: No.  Patient alert and oriented: yes  Behavior appropriate: Yes. ; If no, describe:  Nutrition and fluids offered: Yes  Toileting and hygiene offered: Yes  Sitter present: not applicable, Q 15 min safety rounds and observation.  Law enforcement present: Yes ODS  

## 2015-04-18 NOTE — Discharge Instructions (Signed)
You have been seen in the Emergency Department (ED) today for a psychiatric complaint.  You have been evaluated by psychiatry and we believe you are safe to be discharged from the hospital.    Please return to the ED immediately if you have ANY thoughts of hurting yourself or anyone else, so that we may help you.  Please avoid alcohol and drug use.  Follow up with your doctor and/or therapist as soon as possible regarding today's ED visit.   Please follow up any other recommendations and clinic appointments provided by the psychiatry team that saw you in the Emergency Department.   Major Depressive Disorder Major depressive disorder is a mental illness. It also may be called clinical depression or unipolar depression. Major depressive disorder usually causes feelings of sadness, hopelessness, or helplessness. Some people with this disorder do not feel particularly sad but lose interest in doing things they used to enjoy (anhedonia). Major depressive disorder also can cause physical symptoms. It can interfere with work, school, relationships, and other normal everyday activities. The disorder varies in severity but is longer lasting and more serious than the sadness we all feel from time to time in our lives. Major depressive disorder often is triggered by stressful life events or major life changes. Examples of these triggers include divorce, loss of your job or home, a move, and the death of a family member or close friend. Sometimes this disorder occurs for no obvious reason at all. People who have family members with major depressive disorder or bipolar disorder are at higher risk for developing this disorder, with or without life stressors. Major depressive disorder can occur at any age. It may occur just once in your life (single episode major depressive disorder). It may occur multiple times (recurrent major depressive disorder). SYMPTOMS People with major depressive disorder have either anhedonia  or depressed mood on nearly a daily basis for at least 2 weeks or longer. Symptoms of depressed mood include:  Feelings of sadness (blue or down in the dumps) or emptiness.  Feelings of hopelessness or helplessness.  Tearfulness or episodes of crying (may be observed by others).  Irritability (children and adolescents). In addition to depressed mood or anhedonia or both, people with this disorder have at least four of the following symptoms:  Difficulty sleeping or sleeping too much.   Significant change (increase or decrease) in appetite or weight.   Lack of energy or motivation.  Feelings of guilt and worthlessness.   Difficulty concentrating, remembering, or making decisions.  Unusually slow movement (psychomotor retardation) or restlessness (as observed by others).   Recurrent wishes for death, recurrent thoughts of self-harm (suicide), or a suicide attempt. People with major depressive disorder commonly have persistent negative thoughts about themselves, other people, and the world. People with severe major depressive disorder may experiencedistorted beliefs or perceptions about the world (psychotic delusions). They also may see or hear things that are not real (psychotic hallucinations). DIAGNOSIS Major depressive disorder is diagnosed through an assessment by your health care provider. Your health care provider will ask aboutaspects of your daily life, such as mood,sleep, and appetite, to see if you have the diagnostic symptoms of major depressive disorder. Your health care provider may ask about your medical history and use of alcohol or drugs, including prescription medicines. Your health care provider also may do a physical exam and blood work. This is because certain medical conditions and the use of certain substances can cause major depressive disorder-like symptoms (secondary depression). Your health  care provider also may refer you to a mental health specialist for  further evaluation and treatment. °TREATMENT °It is important to recognize the symptoms of major depressive disorder and seek treatment. The following treatments can be prescribed for this disorder:   °· Medicine. Antidepressant medicines usually are prescribed. Antidepressant medicines are thought to correct chemical imbalances in the brain that are commonly associated with major depressive disorder. Other types of medicine may be added if the symptoms do not respond to antidepressant medicines alone or if psychotic delusions or hallucinations occur. °· Talk therapy. Talk therapy can be helpful in treating major depressive disorder by providing support, education, and guidance. Certain types of talk therapy also can help with negative thinking (cognitive behavioral therapy) and with relationship issues that trigger this disorder (interpersonal therapy). °A mental health specialist can help determine which treatment is best for you. Most people with major depressive disorder do well with a combination of medicine and talk therapy. Treatments involving electrical stimulation of the brain can be used in situations with extremely severe symptoms or when medicine and talk therapy do not work over time. These treatments include electroconvulsive therapy, transcranial magnetic stimulation, and vagal nerve stimulation. °  °This information is not intended to replace advice given to you by your health care provider. Make sure you discuss any questions you have with your health care provider. °  °Document Released: 07/28/2012 Document Revised: 04/23/2014 Document Reviewed: 07/28/2012 °Elsevier Interactive Patient Education ©2016 Elsevier Inc. ° °

## 2015-04-18 NOTE — ED Notes (Signed)
Patient resting quietly in room. Requested to make a phone call. No noted distress or abnormal behaviors noted. Will continue 15 minute checks and observation by security camera for safety.

## 2015-04-18 NOTE — Progress Notes (Signed)
Per request of Dr. Toni Amendlapacs, writer provided the pt. with information and instructions on how to access [facility]  04/18/2015 Brandy FlashNicole Ygnacio Fecteau, MS, NCC, LPCA Therapeutic Triage Specialist

## 2015-04-18 NOTE — ED Notes (Addendum)
Psychiatrist at bedside. Maintained on 15 minute checks and observation by security camera for safety. 

## 2015-04-18 NOTE — ED Notes (Signed)
Report received from Ann RN

## 2015-04-25 ENCOUNTER — Encounter (HOSPITAL_COMMUNITY): Payer: Self-pay | Admitting: *Deleted

## 2016-04-16 NOTE — L&D Delivery Note (Signed)
Patient is a 26 y.o. now [id number] s/p NSVD at [id number], who was admitted for SOL..  Delivery Note At 10:06 PM a viable female was delivered via Vaginal, Spontaneous (Presentation: vertex; LOA).  APGAR: 9, 9; weight 7 lb 4.6 oz (3305 g).   Placenta status: intact.  Cord:  3-vessel  Anesthesia:  none Episiotomy: None Lacerations: None Suture Repair: none Est. Blood Loss (mL): 200 immediate --> total 860 ml  Head delivered LOA. Nuchal cord present, loose, and shoulders delivered through, then nuchal reduced. Body delivered in usual fashion. Thick terminal meconium noted. Infant with spontaneous cry, placed on mother's abdomen, dried and bulb suctioned. Cord clamped x 2 after 1-minute delay, and cut by family member. Cord blood drawn. Placenta delivered spontaneously with gentle cord traction. Fundus firm with massage and Pitocin. Perineum inspected and found to have no lacerations.  About 40 minutes after delivery, patient noted to have brisk bleed with fundal massage. Cytote 800 mcg given buccally. On evaluation uterine atony noted. Methergine given bladder emptied, and bimanual massage done. Bleed controlled, but persistent uterine atony. Clots cleared from lower uterine segment. Hemabate given. Bleeding controlled total measured EBL 650 ml now. Pt left in stable condition and firm uterus.   Around midnight notified that patient with brisk bleeding with fundal rub again. Tranexamic acid 1 g given IV. Bleeding controlled. Total EBL now 860 ml. Hgb at 23:38 12.3 (form 12.5). Will repeat CBC in am.   Mom to postpartum.  Baby to Couplet care / Skin to Skin.  Raynelle FanningJulie P. Kamie Korber, MD OB Fellow 04/13/17, 2:06 AM

## 2016-08-15 ENCOUNTER — Emergency Department (HOSPITAL_COMMUNITY)
Admission: EM | Admit: 2016-08-15 | Discharge: 2016-08-15 | Disposition: A | Discharge status: Home / Self Care | Payer: Self-pay | Attending: Emergency Medicine | Admitting: Emergency Medicine

## 2016-08-15 ENCOUNTER — Emergency Department (HOSPITAL_COMMUNITY): Payer: Self-pay

## 2016-08-15 ENCOUNTER — Encounter (HOSPITAL_COMMUNITY): Payer: Self-pay | Admitting: *Deleted

## 2016-08-15 DIAGNOSIS — O26899 Other specified pregnancy related conditions, unspecified trimester: Secondary | ICD-10-CM

## 2016-08-15 DIAGNOSIS — O26891 Other specified pregnancy related conditions, first trimester: Secondary | ICD-10-CM | POA: Insufficient documentation

## 2016-08-15 DIAGNOSIS — Z3A01 Less than 8 weeks gestation of pregnancy: Secondary | ICD-10-CM | POA: Insufficient documentation

## 2016-08-15 DIAGNOSIS — R109 Unspecified abdominal pain: Secondary | ICD-10-CM

## 2016-08-15 DIAGNOSIS — R1031 Right lower quadrant pain: Secondary | ICD-10-CM | POA: Insufficient documentation

## 2016-08-15 LAB — URINALYSIS, ROUTINE W REFLEX MICROSCOPIC
BILIRUBIN URINE: NEGATIVE
GLUCOSE, UA: NEGATIVE mg/dL
Hgb urine dipstick: NEGATIVE
KETONES UR: 80 mg/dL — AB
Leukocytes, UA: NEGATIVE
NITRITE: NEGATIVE
PH: 5 (ref 5.0–8.0)
PROTEIN: NEGATIVE mg/dL
SPECIFIC GRAVITY, URINE: 1.02 (ref 1.005–1.030)

## 2016-08-15 LAB — CBC WITH DIFFERENTIAL/PLATELET
Basophils Absolute: 0 10*3/uL (ref 0.0–0.1)
Basophils Relative: 0 %
EOS ABS: 0.1 10*3/uL (ref 0.0–0.7)
Eosinophils Relative: 1 %
HCT: 36 % (ref 36.0–46.0)
Hemoglobin: 11.4 g/dL — ABNORMAL LOW (ref 12.0–15.0)
LYMPHS ABS: 2.2 10*3/uL (ref 0.7–4.0)
Lymphocytes Relative: 24 %
MCH: 26.4 pg (ref 26.0–34.0)
MCHC: 31.7 g/dL (ref 30.0–36.0)
MCV: 83.3 fL (ref 78.0–100.0)
MONO ABS: 0.5 10*3/uL (ref 0.1–1.0)
Monocytes Relative: 5 %
Neutro Abs: 6.4 10*3/uL (ref 1.7–7.7)
Neutrophils Relative %: 70 %
PLATELETS: 245 10*3/uL (ref 150–400)
RBC: 4.32 MIL/uL (ref 3.87–5.11)
RDW: 13.4 % (ref 11.5–15.5)
WBC: 9.2 10*3/uL (ref 4.0–10.5)

## 2016-08-15 LAB — WET PREP, GENITAL
Sperm: NONE SEEN
TRICH WET PREP: NONE SEEN
YEAST WET PREP: NONE SEEN

## 2016-08-15 LAB — I-STAT BETA HCG BLOOD, ED (MC, WL, AP ONLY): I-stat hCG, quantitative: 994 m[IU]/mL — ABNORMAL HIGH (ref <5)

## 2016-08-15 NOTE — Discharge Instructions (Signed)
You will need to go for follow up to [facility] Saturday morning to repeat the pregnancy hormone level to be sure it is rising normally. Go sooner for increased pain or bleeding.

## 2016-08-15 NOTE — ED Triage Notes (Signed)
Pt reports +preg test, having lower abd cramping this am. Denies bleeding or spotting.

## 2016-08-15 NOTE — ED Provider Notes (Signed)
[facility]-EMERGENCY DEPT Provider Note   By signing my name below, I, Earmon Phoenix, attest that this documentation has been prepared under the direction and in the presence of [facility] [place]. Electronically Signed: Earmon Phoenix, ED Scribe. 08/15/16. 11:07 PM.   History   Chief Complaint Chief Complaint  Patient presents with  . Abdominal Cramping   The history is provided by the patient. No language interpreter was used.   Brandy Kaufman is an obese 26 y.o. female who presents to the Emergency Department complaining of abdominal pain that began this morning. She reports taking a home pregnancy test recently that was positive. She reports associated nausea and vomiting. She has not done anything for treatment of her symptoms. There are no modifying factors noted. She denies fever, chills, otalgia, rhinorrhea, sore throat, vaginal bleeding. Her last pap smear was two months ago at [facility]. Her LMP was 07/06/16.   Past Medical History:  Diagnosis Date  . Depression   . Medical history non-contributory     Patient Active Problem List   Diagnosis Date Noted  . Adjustment disorder with mixed disturbance of emotions and conduct 04/18/2015  . Agitation 04/18/2015  . Depression, major, single episode, moderate (HCC) 04/18/2015  . Alcohol intoxication (HCC) 04/18/2015  . Moderate single current episode of major depressive disorder (HCC)   . MDD (major depressive disorder) 02/12/2015  . Postpartum depression 01/27/2014  . VBAC, delivered, current hospitalization 12/13/2013  . HSV-1 Pos 06/10/2013  . Abnormal Pap smear of cervix 06/08/2013  . Vaginal septum affecting pregnancy 06/08/2013  . Previous cesarean section 06/01/2013    Past Surgical History:  Procedure Laterality Date  . CESAREAN SECTION      OB History    Gravida Para Term Preterm AB Living   0 0 2   SAB TAB Ectopic Multiple Live Births   0 0 0   2       Home Medications    Prior to Admission  medications   Medication Sig Start Date End Date Taking? Authorizing Provider  citalopram (CELEXA) 20 MG tablet Take 1 tablet (20 mg total) by mouth daily. 02/16/15   Beau Fanny, FNP  citalopram (CELEXA) 20 MG tablet Take 1 tablet (20 mg total) by mouth daily. 04/18/15   Audery Amel, MD  traZODone (DESYREL) 50 MG tablet Take 1 tablet (50 mg total) by mouth at bedtime as needed for sleep. 02/16/15   Beau Fanny, FNP    Family History Family History  Problem Relation Age of Onset  . Diabetes Father   . Diabetes Paternal Grandmother     Social History Social History  Substance Use Topics  . Smoking status: Never Smoker  . Smokeless tobacco: Not on file  . Alcohol use Yes     Allergies   Patient has no known allergies.   Review of Systems Review of Systems  Constitutional: Negative for chills and fever.  HENT: Negative for ear pain, rhinorrhea and sore throat.   Gastrointestinal: Positive for abdominal pain, nausea and vomiting.  Genitourinary: Negative for vaginal bleeding.  Musculoskeletal: Positive for back pain.     Physical Exam Updated Vital Signs BP 119/71 (BP Location: Left Arm)   Pulse 74   Temp 98.4 F (36.9 C) (Oral)   Resp 18   LMP 07/06/2016   SpO2 100%   Physical Exam  Constitutional: She is oriented to person, place, and time. She appears well-developed and well-nourished. No distress.  HENT:  Head:  Normocephalic.  Eyes: Conjunctivae and EOM are normal.  Neck: Neck supple.  Cardiovascular: Normal rate and regular rhythm.   Pulmonary/Chest: Effort normal and breath sounds normal.  Abdominal: Soft. Bowel sounds are normal. There is tenderness in the suprapubic area. There is no rebound, no guarding and no CVA tenderness.  Genitourinary:  Genitourinary Comments: External genitalia without lesions, white d/c vaginal vault, no bleeding, cervix closed, no CMT, no adnexal tenderness. Uterus upper limits normal.   Musculoskeletal: Normal range of  motion.  Neurological: She is alert and oriented to person, place, and time. No cranial nerve deficit.  Skin: Skin is warm and dry.  Psychiatric: She has a normal mood and affect. Her behavior is normal.  Nursing note and vitals reviewed.    ED Treatments / Results  DIAGNOSTIC STUDIES: Oxygen Saturation is 100% on RA, normal by my interpretation.    COORDINATION OF CARE: 9:07 PM- Will perform pelvic exam. Will order ultrasound. Pt advised of plan for treatment and pt agrees.   Labs (all labs ordered are listed, but only abnormal results are displayed) Labs Reviewed  WET PREP, GENITAL - Abnormal; Notable for the following:       Result Value   Clue Cells Wet Prep HPF POC PRESENT (*)    WBC, Wet Prep HPF POC MANY (*)    All other components within normal limits  CBC WITH DIFFERENTIAL/PLATELET - Abnormal; Notable for the following:    Hemoglobin 11.4 (*)    All other components within normal limits  URINALYSIS, ROUTINE W REFLEX MICROSCOPIC - Abnormal; Notable for the following:    APPearance HAZY (*)    Ketones, ur 80 (*)    All other components within normal limits  I-STAT BETA HCG BLOOD, ED ([facility], [facility], [facility] ONLY) - Abnormal; Notable for the following:    I-stat hCG, quantitative 994.0 (*)    All other components within normal limits  GC/CHLAMYDIA PROBE AMP ([place]) NOT AT [facility]    Radiology US Ob Comp Less 14 Wks  Result Date: 08/15/2016 CLINICAL DATA:  Abdominal pain this morning.  Beta HCG 994 EXAM: OBSTETRIC <14 WK [place] AND TRANSVAGINAL OB US TECHNIQUE: Both transabdominal and transvaginal ultrasound examinations were performed for complete evaluation of the gestation as well as the maternal uterus, adnexal regions, and pelvic cul-de-sac. Transvaginal technique was performed to assess early pregnancy. COMPARISON:  None. FINDINGS: Intrauterine gestational sac: Single Yolk sac:  Not Visualized. Embryo:  Not Visualized. Cardiac Activity: Not Visualized. Heart Rate: Not applicable  Subchorionic hemorrhage:  None visualized. Maternal uterus/adnexae: The right ovary measured 3.3 x 2.8 x 2.2 cm with a small parovarian cyst measuring 1.5 x 1.3 x 1.5 cm. No demonstrable decidual reaction on the right to suggest an ectopic. Left ovary was 4.2 x 2.9 x 3.2 cm with a 2.6 x 2.1 x 2.3 cm cyst likely the corpus luteum. IMPRESSION: Probable early intrauterine gestational sac, but no yolk sac, fetal pole, or cardiac activity yet visualized. Recommend follow-up quantitative B-HCG levels and follow-up US in 14 days to assess viability. This recommendation follows SRU consensus guidelines: Diagnostic Criteria for Nonviable Pregnancy Early in the First Trimester. Malva Limes Med 2013; 161:0960-45. Electronically Signed   By: Tollie Eth M.D.   On: 08/15/2016 23:00   US Ob Transvaginal  Result Date: 08/15/2016 CLINICAL DATA:  Abdominal pain this morning.  Beta HCG 994 EXAM: OBSTETRIC <14 WK [place] AND TRANSVAGINAL OB US TECHNIQUE: Both transabdominal and transvaginal ultrasound examinations were performed for complete evaluation  of the gestation as well as the maternal uterus, adnexal regions, and pelvic cul-de-sac. Transvaginal technique was performed to assess early pregnancy. COMPARISON:  None. FINDINGS: Intrauterine gestational sac: Single Yolk sac:  Not Visualized. Embryo:  Not Visualized. Cardiac Activity: Not Visualized. Heart Rate: Not applicable Subchorionic hemorrhage:  None visualized. Maternal uterus/adnexae: The right ovary measured 3.3 x 2.8 x 2.2 cm with a small parovarian cyst measuring 1.5 x 1.3 x 1.5 cm. No demonstrable decidual reaction on the right to suggest an ectopic. Left ovary was 4.2 x 2.9 x 3.2 cm with a 2.6 x 2.1 x 2.3 cm cyst likely the corpus luteum. IMPRESSION: Probable early intrauterine gestational sac, but no yolk sac, fetal pole, or cardiac activity yet visualized. Recommend follow-up quantitative B-HCG levels and follow-up US in 14 days to assess viability. This recommendation  follows SRU consensus guidelines: Diagnostic Criteria for Nonviable Pregnancy Early in the First Trimester. Malva Limes Med 2013; 161:0960-45. Electronically Signed   By: Tollie Eth M.D.   On: 08/15/2016 23:00    Procedures Pelvic exam Date/Time: 08/15/2016 9:49 PM Performed by: Janne Napoleon Authorized by: Janne Napoleon  Consent: Verbal consent obtained. Consent given by: patient Patient understanding: patient states understanding of the procedure being performed Required items: required blood products, implants, devices, and special equipment available Patient identity confirmed: verbally with patient Local anesthesia used: no  Anesthesia: Local anesthesia used: no  Sedation: Patient sedated: no Patient tolerance: Patient tolerated the procedure well with no immediate complications    (including critical care time)  Medications Ordered in ED Medications - No data to display   Initial Impression / Assessment and Plan / ED Course  I have reviewed the triage vital signs and the nursing notes.  Pertinent labs & imaging results that were available during my care of the patient were reviewed by me and considered in my medical decision making (see chart for details).  I spoke with Thressa Sheller, CNM at [facility] and she will schedule patient for follow up on Saturday am.   Final Clinical Impressions(s) / ED Diagnoses  25 y.o. female with lower abdominal cramping in early pregnancy stable for d/c without fever or acute abdomen. Most likely early IUP. Patient to f/u in MAU in 48 hours for repeat Bhcg to assure appropriate rise. I discussed findings and plan of care with the patient and she voices understanding and agrees with plan.  Final diagnoses:  Abdominal pain affecting pregnancy    New Prescriptions Discharge Medication List as of 08/15/2016 11:19 PM     I personally performed the services described in this documentation, which was scribed in my presence. The recorded information  has been reviewed and is accurate.     [address] [place], NP 08/16/16 [id number]    Loren Racer, MD 08/23/16 [phone]

## 2016-08-15 NOTE — ED Notes (Signed)
Patient transported to Ultrasound 

## 2016-08-17 LAB — GC/CHLAMYDIA PROBE AMP (~~LOC~~) NOT AT ARMC
Chlamydia: NEGATIVE
NEISSERIA GONORRHEA: NEGATIVE

## 2016-08-22 ENCOUNTER — Encounter (HOSPITAL_COMMUNITY): Payer: Self-pay | Admitting: *Deleted

## 2016-08-22 ENCOUNTER — Inpatient Hospital Stay (HOSPITAL_COMMUNITY)
Admission: AD | Admit: 2016-08-22 | Discharge: 2016-08-22 | Disposition: A | Discharge status: Home / Self Care | Payer: Self-pay | Source: Ambulatory Visit | Attending: Family Medicine | Admitting: Family Medicine

## 2016-08-22 DIAGNOSIS — O9989 Other specified diseases and conditions complicating pregnancy, childbirth and the puerperium: Secondary | ICD-10-CM

## 2016-08-22 DIAGNOSIS — Z79899 Other long term (current) drug therapy: Secondary | ICD-10-CM | POA: Insufficient documentation

## 2016-08-22 DIAGNOSIS — Z833 Family history of diabetes mellitus: Secondary | ICD-10-CM | POA: Insufficient documentation

## 2016-08-22 DIAGNOSIS — Z3A01 Less than 8 weeks gestation of pregnancy: Secondary | ICD-10-CM | POA: Insufficient documentation

## 2016-08-22 DIAGNOSIS — O99341 Other mental disorders complicating pregnancy, first trimester: Secondary | ICD-10-CM | POA: Insufficient documentation

## 2016-08-22 DIAGNOSIS — F329 Major depressive disorder, single episode, unspecified: Secondary | ICD-10-CM | POA: Insufficient documentation

## 2016-08-22 DIAGNOSIS — R109 Unspecified abdominal pain: Secondary | ICD-10-CM | POA: Insufficient documentation

## 2016-08-22 DIAGNOSIS — O26891 Other specified pregnancy related conditions, first trimester: Secondary | ICD-10-CM | POA: Insufficient documentation

## 2016-08-22 DIAGNOSIS — Z809 Family history of malignant neoplasm, unspecified: Secondary | ICD-10-CM | POA: Insufficient documentation

## 2016-08-22 DIAGNOSIS — E349 Endocrine disorder, unspecified: Secondary | ICD-10-CM

## 2016-08-22 LAB — HCG, QUANTITATIVE, PREGNANCY: HCG, BETA CHAIN, QUANT, S: 8686 m[IU]/mL — AB (ref <5)

## 2016-08-22 NOTE — Discharge Instructions (Signed)
First Trimester of Pregnancy The first trimester of pregnancy is from week 1 until the end of week 13 (months 1 through 3). During this time, your baby will begin to develop inside you. At 6-8 weeks, the eyes and face are formed, and the heartbeat can be seen on ultrasound. At the end of 12 weeks, all the baby's organs are formed. Prenatal care is all the medical care you receive before the birth of your baby. Make sure you get good prenatal care and follow all of your doctor's instructions. Follow these instructions at home: Medicines  Take over-the-counter and prescription medicines only as told by your doctor. Some medicines are safe and some medicines are not safe during pregnancy.  Take a prenatal vitamin that contains at least 600 micrograms (mcg) of folic acid.  If you have trouble pooping (constipation), take medicine that will make your stool soft (stool softener) if your doctor approves. Eating and drinking  Eat regular, healthy meals.  Your doctor will tell you the amount of weight gain that is right for you.  Avoid raw meat and uncooked cheese.  If you feel sick to your stomach (nauseous) or throw up (vomit): ? Eat 4 or 5 small meals a day instead of 3 large meals. ? Try eating a few soda crackers. ? Drink liquids between meals instead of during meals.  To prevent constipation: ? Eat foods that are high in fiber, like fresh fruits and vegetables, whole grains, and beans. ? Drink enough fluids to keep your pee (urine) clear or pale yellow. Activity  Exercise only as told by your doctor. Stop exercising if you have cramps or pain in your lower belly (abdomen) or low back.  Do not exercise if it is too hot, too humid, or if you are in a place of great height (high altitude).  Try to avoid standing for long periods of time. Move your legs often if you must stand in one place for a long time.  Avoid heavy lifting.  Wear low-heeled shoes. Sit and stand up straight.  You  can have sex unless your doctor tells you not to. Relieving pain and discomfort  Wear a good support bra if your breasts are sore.  Take warm water baths (sitz baths) to soothe pain or discomfort caused by hemorrhoids. Use hemorrhoid cream if your doctor says it is okay.  Rest with your legs raised if you have leg cramps or low back pain.  If you have puffy, bulging veins (varicose veins) in your legs: ? Wear support hose or compression stockings as told by your doctor. ? Raise (elevate) your feet for 15 minutes, 3-4 times a day. ? Limit salt in your food. Prenatal care  Schedule your prenatal visits by the twelfth week of pregnancy.  Write down your questions. Take them to your prenatal visits.  Keep all your prenatal visits as told by your doctor. This is important. Safety  Wear your seat belt at all times when driving.  Make a list of emergency phone numbers. The list should include numbers for family, friends, the hospital, and police and fire departments. General instructions  Ask your doctor for a referral to a local prenatal class. Begin classes no later than at the start of month 6 of your pregnancy.  Ask for help if you need counseling or if you need help with nutrition. Your doctor can give you advice or tell you where to go for help.  Do not use hot tubs, steam rooms, or   saunas.  Do not douche or use tampons or scented sanitary pads.  Do not cross your legs for long periods of time.  Avoid all herbs and alcohol. Avoid drugs that are not approved by your doctor.  Do not use any tobacco products, including cigarettes, chewing tobacco, and electronic cigarettes. If you need help quitting, ask your doctor. You may get counseling or other support to help you quit.  Avoid cat litter boxes and soil used by cats. These carry germs that can cause birth defects in the baby and can cause a loss of your baby (miscarriage) or stillbirth.  Visit your dentist. At home, brush  your teeth with a soft toothbrush. Be gentle when you floss. Contact a doctor if:  You are dizzy.  You have mild cramps or pressure in your lower belly.  You have a nagging pain in your belly area.  You continue to feel sick to your stomach, you throw up, or you have watery poop (diarrhea).  You have a bad smelling fluid coming from your vagina.  You have pain when you pee (urinate).  You have increased puffiness (swelling) in your face, hands, legs, or ankles. Get help right away if:  You have a fever.  You are leaking fluid from your vagina.  You have spotting or bleeding from your vagina.  You have very bad belly cramping or pain.  You gain or lose weight rapidly.  You throw up blood. It may look like coffee grounds.  You are around people who have German measles, fifth disease, or chickenpox.  You have a very bad headache.  You have shortness of breath.  You have any kind of trauma, such as from a fall or a car accident. Summary  The first trimester of pregnancy is from week 1 until the end of week 13 (months 1 through 3).  To take care of yourself and your unborn baby, you will need to eat healthy meals, take medicines only if your doctor tells you to do so, and do activities that are safe for you and your baby.  Keep all follow-up visits as told by your doctor. This is important as your doctor will have to ensure that your baby is healthy and growing well. This information is not intended to replace advice given to you by your health care provider. Make sure you discuss any questions you have with your health care provider. Document Released: 09/19/2007 Document Revised: 04/10/2016 Document Reviewed: 04/10/2016 Elsevier Interactive Patient Education  2017 Elsevier Inc.  

## 2016-08-22 NOTE — MAU Note (Signed)
was in the ER the the other day because she had cramping.  Didn't come in because of work. Doing better, no pain today.  No bleeding

## 2016-08-22 NOTE — MAU Provider Note (Signed)
History   [id number]   Chief Complaint  Patient presents with  . Follow-up    HPI Brandy Kaufman is a 26 y.o. female G3P2002 here for follow-up BHCG.  Upon review of the records patient was first seen on 5/2 for abdominal pain. BHCG on that day was 994. Ultrasound showed possible IUGS, no yolk sac, no evidence of ectopic.  GC/CT and wet prep were collected. Results were + for BV. Pt discharged home in stable condition with instructions to f[facility]u in [facility]. Pt states she was unable to come for f/u BHCG d/t her work schedule. Presents today for repeat BHCG. Denies abdominal pain or vaginal bleeding.    Patient's last menstrual period was 07/06/2016.  OB History  Gravida Para Term Preterm AB Living  3 2 2  0 0 2  SAB TAB Ectopic Multiple Live Births  0 0 0   2    # Outcome Date GA Lbr Len/2nd Weight Sex Delivery Anes PTL Lv  3 Current           2 Term 12/13/13 5861w0d 01:48 / 00:19 6 lb 14.8 oz (3.141 kg) F VBAC None  LIV  1 Term 10/12/08 7975w0d  7 lb 0.5 oz (3.189 kg) F CS-LTranv EPI  LIV      Past Medical History:  Diagnosis Date  . Depression     Family History  Problem Relation Age of Onset  . Diabetes Father   . Diabetes Paternal Grandmother   . Cancer Paternal Grandmother     Social History   Social History  . Marital status: Single    Spouse name: N/A  . Number of children: N/A  . Years of education: N/A   Social History Main Topics  . Smoking status: Never Smoker  . Smokeless tobacco: Never Used  . Alcohol use Yes     Comment: 1 or 2/month  . Drug use: No  . Sexual activity: Yes    Birth control/ protection: None   Other Topics Concern  . Not on file   Social History Narrative   ** Merged History Encounter **        No Known Allergies  No current facility-administered medications on file prior to encounter.    Current Outpatient Prescriptions on File Prior to Encounter  Medication Sig Dispense Refill  .  citalopram (CELEXA) 20 MG tablet Take 1 tablet (20 mg total) by mouth daily. 30 tablet 0  . citalopram (CELEXA) 20 MG tablet Take 1 tablet (20 mg total) by mouth daily. 30 tablet 0  . traZODone (DESYREL) 50 MG tablet Take 1 tablet (50 mg total) by mouth at bedtime as needed for sleep. 30 tablet 0     Physical Exam   Vitals:   08/22/16 1739  BP: (!) 109/52  Pulse: 60  Resp: 18  Temp: 98.5 F (36.9 C)  TempSrc: Oral  SpO2: 100%  Weight: 170 lb 4 oz (77.2 kg)    Physical Exam  Nursing note and vitals reviewed. Constitutional: She is oriented to person, place, and time. She appears well-developed and well-nourished. No distress.  HENT:  Head: Normocephalic and atraumatic.  Eyes: Conjunctivae are normal. Right eye exhibits no discharge. Left eye exhibits no discharge. No scleral icterus.  Neck: Normal range of motion.  Respiratory: Effort normal. No respiratory distress.  Neurological: She is alert and oriented to person, place, and time.  Skin: Skin is warm and dry. She is not diaphoretic.  Psychiatric: She  has a normal mood and affect. Her behavior is normal. Judgment and thought content normal.    MAU Course  Procedures Results for orders placed or performed during the hospital encounter of 08/22/16 (from the past 24 hour(s))  hCG, quantitative, pregnancy     Status: Abnormal   Collection Time: 08/22/16  5:42 PM  Result Value Ref Range   hCG, Beta Chain, Quant, S 8,686 (H) <5 mIU/mL    MDM Appropriate rise in BHCG Pt asymptomatic Will order outpatient ultrasound for viability  Assessment and Plan  26 y.o. G3P2002 at [redacted]w[redacted]d wks Pregnancy Follow-up BHCG 1. Elevated serum hCG    P: Discharge home Pregnancy verification letter given Outpatient ultrasound ordered Msg to [facility] for appt to discuss ultrasound results  Judeth Horn, NP 08/22/2016 7:02 PM

## 2016-08-30 ENCOUNTER — Ambulatory Visit (HOSPITAL_COMMUNITY): Admission: RE | Admit: 2016-08-30 | Payer: Self-pay | Source: Ambulatory Visit

## 2016-09-04 ENCOUNTER — Ambulatory Visit (HOSPITAL_COMMUNITY): Payer: Self-pay

## 2016-09-17 ENCOUNTER — Ambulatory Visit (HOSPITAL_COMMUNITY): Admission: RE | Admit: 2016-09-17 | Payer: Self-pay | Source: Ambulatory Visit

## 2016-09-18 ENCOUNTER — Other Ambulatory Visit (HOSPITAL_COMMUNITY): Payer: Self-pay | Admitting: Student

## 2016-09-18 ENCOUNTER — Ambulatory Visit: Payer: Self-pay | Admitting: *Deleted

## 2016-09-18 ENCOUNTER — Encounter: Payer: Self-pay | Admitting: Family Medicine

## 2016-09-18 ENCOUNTER — Ambulatory Visit (HOSPITAL_COMMUNITY)
Admission: RE | Admit: 2016-09-18 | Discharge: 2016-09-18 | Disposition: A | Discharge status: Home / Self Care | Payer: Self-pay | Source: Ambulatory Visit | Attending: Student | Admitting: Student

## 2016-09-18 DIAGNOSIS — N83201 Unspecified ovarian cyst, right side: Secondary | ICD-10-CM | POA: Insufficient documentation

## 2016-09-18 DIAGNOSIS — E349 Endocrine disorder, unspecified: Secondary | ICD-10-CM | POA: Insufficient documentation

## 2016-09-18 DIAGNOSIS — O3481 Maternal care for other abnormalities of pelvic organs, first trimester: Secondary | ICD-10-CM | POA: Insufficient documentation

## 2016-09-18 DIAGNOSIS — Z712 Person consulting for explanation of examination or test findings: Secondary | ICD-10-CM

## 2016-09-18 DIAGNOSIS — O99281 Endocrine, nutritional and metabolic diseases complicating pregnancy, first trimester: Secondary | ICD-10-CM | POA: Insufficient documentation

## 2016-09-18 DIAGNOSIS — Z3A09 9 weeks gestation of pregnancy: Secondary | ICD-10-CM | POA: Insufficient documentation

## 2016-09-18 NOTE — Progress Notes (Signed)
Pt and significant other informed of US results showing viable intrauterine pregnancy and normal fetal heart rate.  Pt states she will likely schedule prenatal care @ [facility]. Pt had no questions.

## 2017-01-04 ENCOUNTER — Other Ambulatory Visit: Payer: Self-pay | Admitting: Emergency Medicine

## 2017-01-04 DIAGNOSIS — Z3481 Encounter for supervision of other normal pregnancy, first trimester: Secondary | ICD-10-CM

## 2017-01-22 ENCOUNTER — Ambulatory Visit
Admission: RE | Admit: 2017-01-22 | Discharge: 2017-01-22 | Disposition: A | Discharge status: Home / Self Care | Payer: Self-pay | Source: Ambulatory Visit | Attending: Emergency Medicine | Admitting: Emergency Medicine

## 2017-01-22 DIAGNOSIS — Z3481 Encounter for supervision of other normal pregnancy, first trimester: Secondary | ICD-10-CM | POA: Insufficient documentation

## 2017-04-13 ENCOUNTER — Inpatient Hospital Stay (HOSPITAL_COMMUNITY)
Admission: AD | Admit: 2017-04-13 | Discharge: 2017-04-13 | Disposition: A | Discharge status: Home / Self Care | Payer: Medicaid Other | Source: Ambulatory Visit | Attending: Obstetrics and Gynecology | Admitting: Obstetrics and Gynecology

## 2017-04-13 ENCOUNTER — Inpatient Hospital Stay (HOSPITAL_COMMUNITY)
Admission: AD | Admit: 2017-04-13 | Discharge: 2017-04-15 | DRG: 797 | Disposition: A | Discharge status: Home / Self Care | Payer: Medicaid Other | Source: Ambulatory Visit | Attending: Obstetrics and Gynecology | Admitting: Obstetrics and Gynecology

## 2017-04-13 ENCOUNTER — Encounter (HOSPITAL_COMMUNITY): Payer: Self-pay

## 2017-04-13 ENCOUNTER — Encounter (HOSPITAL_COMMUNITY): Payer: Self-pay | Admitting: Obstetrics and Gynecology

## 2017-04-13 ENCOUNTER — Other Ambulatory Visit: Payer: Self-pay

## 2017-04-13 DIAGNOSIS — O479 False labor, unspecified: Secondary | ICD-10-CM

## 2017-04-13 DIAGNOSIS — Z3A40 40 weeks gestation of pregnancy: Secondary | ICD-10-CM

## 2017-04-13 DIAGNOSIS — Z9851 Tubal ligation status: Secondary | ICD-10-CM

## 2017-04-13 DIAGNOSIS — Z3A00 Weeks of gestation of pregnancy not specified: Secondary | ICD-10-CM

## 2017-04-13 DIAGNOSIS — O480 Post-term pregnancy: Secondary | ICD-10-CM | POA: Diagnosis not present

## 2017-04-13 DIAGNOSIS — Z3483 Encounter for supervision of other normal pregnancy, third trimester: Secondary | ICD-10-CM | POA: Diagnosis present

## 2017-04-13 DIAGNOSIS — Z302 Encounter for sterilization: Secondary | ICD-10-CM | POA: Diagnosis not present

## 2017-04-13 DIAGNOSIS — O34219 Maternal care for unspecified type scar from previous cesarean delivery: Principal | ICD-10-CM | POA: Diagnosis present

## 2017-04-13 LAB — CBC
HCT: 36.6 % (ref 36.0–46.0)
HEMATOCRIT: 37.5 % (ref 36.0–46.0)
HEMOGLOBIN: 12.5 g/dL (ref 12.0–15.0)
Hemoglobin: 12.3 g/dL (ref 12.0–15.0)
MCH: 28.2 pg (ref 26.0–34.0)
MCH: 28.3 pg (ref 26.0–34.0)
MCHC: 33.3 g/dL (ref 30.0–36.0)
MCHC: 33.6 g/dL (ref 30.0–36.0)
MCV: 84.5 fL (ref 78.0–100.0)
MCV: 84.3 fL (ref 78.0–100.0)
PLATELETS: 160 10*3/uL (ref 150–400)
Platelets: 160 10*3/uL (ref 150–400)
RBC: 4.44 MIL/uL (ref 3.87–5.11)
RBC: 4.34 MIL/uL (ref 3.87–5.11)
RDW: 13.1 % (ref 11.5–15.5)
RDW: 13.3 % (ref 11.5–15.5)
WBC: 12.5 10*3/uL — ABNORMAL HIGH (ref 4.0–10.5)
WBC: 18.3 10*3/uL — AB (ref 4.0–10.5)

## 2017-04-13 LAB — TYPE AND SCREEN
ABO/RH(D): O POS
ANTIBODY SCREEN: NEGATIVE

## 2017-04-13 MED ORDER — LIDOCAINE HCL (PF) 1 % IJ SOLN
INTRAMUSCULAR | Status: AC
  Filled 2017-04-13: qty 30

## 2017-04-13 MED ORDER — OXYCODONE-ACETAMINOPHEN 5-325 MG PO TABS
1.0000 | ORAL_TABLET | ORAL | Status: DC | PRN
  Administered 2017-04-14: 1 via ORAL
  Filled 2017-04-13: qty 1

## 2017-04-13 MED ORDER — LACTATED RINGERS IV SOLN: 500.0000 mL | INTRAVENOUS | Status: DC | PRN

## 2017-04-13 MED ORDER — CARBOPROST TROMETHAMINE 250 MCG/ML IM SOLN
INTRAMUSCULAR | Status: AC
  Administered 2017-04-13: 250 ug
  Filled 2017-04-13: qty 1

## 2017-04-13 MED ORDER — MIDAZOLAM HCL 2 MG/2ML IJ SOLN: 1.0000 mg | INTRAMUSCULAR | Status: DC | PRN

## 2017-04-13 MED ORDER — ACETAMINOPHEN 325 MG PO TABS
650.0000 mg | ORAL_TABLET | ORAL | Status: DC | PRN
  Administered 2017-04-14: 650 mg via ORAL
  Filled 2017-04-13: qty 2

## 2017-04-13 MED ORDER — FENTANYL CITRATE (PF) 100 MCG/2ML IJ SOLN
100.0000 ug | INTRAMUSCULAR | Status: DC | PRN
  Filled 2017-04-13: qty 2

## 2017-04-13 MED ORDER — OXYTOCIN 10 UNIT/ML IJ SOLN
INTRAMUSCULAR | Status: AC
  Filled 2017-04-13: qty 2

## 2017-04-13 MED ORDER — LIDOCAINE HCL (PF) 1 % IJ SOLN
30.0000 mL | INTRAMUSCULAR | Status: DC | PRN
  Filled 2017-04-13: qty 30

## 2017-04-13 MED ORDER — FENTANYL CITRATE (PF) 100 MCG/2ML IJ SOLN
100.0000 ug | INTRAMUSCULAR | Status: DC | PRN
  Administered 2017-04-13: 100 ug via INTRAVENOUS

## 2017-04-13 MED ORDER — CARBOPROST TROMETHAMINE 250 MCG/ML IM SOLN: 250.0000 ug | INTRAMUSCULAR | Status: DC | PRN

## 2017-04-13 MED ORDER — OXYTOCIN 40 UNITS IN LACTATED RINGERS INFUSION - SIMPLE MED
2.5000 [IU]/h | INTRAVENOUS | Status: DC
  Administered 2017-04-13: 2.5 [IU]/h via INTRAVENOUS
  Filled 2017-04-13 (×2): qty 1000

## 2017-04-13 MED ORDER — METHYLERGONOVINE MALEATE 0.2 MG/ML IJ SOLN
0.2000 mg | Freq: Once | INTRAMUSCULAR | Status: AC
  Administered 2017-04-13: 0.2 mg via INTRAMUSCULAR

## 2017-04-13 MED ORDER — OXYTOCIN 40 UNITS IN LACTATED RINGERS INFUSION - SIMPLE MED
INTRAVENOUS | Status: AC
  Filled 2017-04-13: qty 1000

## 2017-04-13 MED ORDER — OXYCODONE-ACETAMINOPHEN 5-325 MG PO TABS: 2.0000 | ORAL_TABLET | ORAL | Status: DC | PRN

## 2017-04-13 MED ORDER — LACTATED RINGERS IV SOLN: INTRAVENOUS | Status: DC

## 2017-04-13 MED ORDER — ONDANSETRON HCL 4 MG/2ML IJ SOLN: 4.0000 mg | Freq: Four times a day (QID) | INTRAMUSCULAR | Status: DC | PRN

## 2017-04-13 MED ORDER — DIPHENOXYLATE-ATROPINE 2.5-0.025 MG PO TABS
2.0000 | ORAL_TABLET | Freq: Once | ORAL | Status: AC
  Administered 2017-04-13: 2 via ORAL
  Filled 2017-04-13: qty 2

## 2017-04-13 MED ORDER — SOD CITRATE-CITRIC ACID 500-334 MG/5ML PO SOLN: 30.0000 mL | ORAL | Status: DC | PRN

## 2017-04-13 MED ORDER — OXYTOCIN BOLUS FROM INFUSION
500.0000 mL | Freq: Once | INTRAVENOUS | Status: AC
  Administered 2017-04-13: 500 mL/h via INTRAVENOUS

## 2017-04-13 MED ORDER — MISOPROSTOL 200 MCG PO TABS
800.0000 ug | ORAL_TABLET | Freq: Once | ORAL | Status: AC
  Administered 2017-04-13: 800 ug via BUCCAL
  Filled 2017-04-13: qty 4

## 2017-04-13 NOTE — MAU Note (Addendum)
Was due yesterday.  Having some contractions- q 5-3710min, and had some bloody d/c this morning. Hasn't been checked in last few wks.  [place] Prior successful VBAC

## 2017-04-13 NOTE — MAU Note (Signed)
I have communicated with Donette LarryMelanie Bhambri CNM and reviewed vital signs:  Vitals:   04/13/17 1338  BP: 111/70  Pulse: 72  Resp: 18  Temp: 98.3 F (36.8 C)  SpO2: 99%    Vaginal exam:  Dilation: 4 Effacement (%): 80 Cervical Position: Middle Station: -3 Presentation: Vertex Exam by:: Laurell Josephsheryl Baby Gieger RN,   Also reviewed contraction pattern and that non-stress test is reactive.  It has been documented that patient is contracting every 5-8 minutes with no cervical change over 1 hours not indicating active labor.  Patient denies any other complaints.  Based on this report provider has given order for discharge.  A discharge order and diagnosis entered by a provider.   Labor discharge instructions reviewed with patient.

## 2017-04-13 NOTE — H&P (Signed)
LABOR AND DELIVERY ADMISSION HISTORY AND PHYSICAL NOTE  Brandy Kaufman is a 26 y.o. female G3P2002 with IUP at 2017w1d by LMP + 28 wk U/S presenting for regular contractions and ROM around 19:30.  She reports positive fetal movement. She denies leakage of fluid or vaginal bleeding.  [facility] Pregnancy complications:  - Prior hx of C/S (s/p VBAC x1)  Past Medical History: Past Medical History:  Diagnosis Date  . Depression     Past Surgical History: Past Surgical History:  Procedure Laterality Date  . CESAREAN SECTION      Obstetrical History: OB History    Gravida Para Term Preterm AB Living   3 2 2  0 0 2   SAB TAB Ectopic Multiple Live Births   0 0 0   2      Social History: Social History   Socioeconomic History  . Marital status: Single    Spouse name: Not on file  . Number of children: Not on file  . Years of education: Not on file  . Highest education level: Not on file  Social Needs  . Financial resource strain: Not on file  . Food insecurity - worry: Not on file  . Food insecurity - inability: Not on file  . Transportation needs - medical: Not on file  . Transportation needs - non-medical: Not on file  Occupational History  . Not on file  Tobacco Use  . Smoking status: Never Smoker  . Smokeless tobacco: Never Used  Substance and Sexual Activity  . Alcohol use: Yes    Comment: 1 or 2/month  . Drug use: No  . Sexual activity: Yes    Birth control/protection: None  Other Topics Concern  . Not on file  Social History Narrative   ** Merged History Encounter **        Family History: Family History  Problem Relation Age of Onset  . Diabetes Father   . Diabetes Paternal Grandmother   . Cancer Paternal Grandmother     Allergies: No Known Allergies  Medications Prior to Admission  Medication Sig Dispense Refill Last Dose  . Prenatal Vit-Fe Fumarate-FA (PRENATAL  MULTIVITAMIN) TABS tablet Take 1 tablet by mouth daily at 12 noon.   Past Month at Unknown time     Review of Systems  All systems reviewed and negative except as stated in HPI  Physical Exam Blood pressure 120/81, pulse 79, temperature 97.7 F (36.5 C), temperature source Oral, resp. rate 18, height 5\' 1"  (1.549 m), weight 195 lb 12 oz (88.8 kg), last menstrual period 07/06/2016. General appearance: alert, oriented, NAD Lungs: normal respiratory effort Heart: regular rate Abdomen: soft, non-tender; gravid, FH appropriate for GA Extremities: No calf swelling or tenderness Presentation: cephalic by SVE Fetal monitoring: baseline rate 130, moderate variblity, +acel, no decel  Dilation: 7.5 Effacement (%): 90 Station: -1 Exam by:: B Mosca RN  Prenatal labs: (from scanned records [employer] tab) ABO, Rh: --/--/O POS (12/29 2005) Antibody:  mgative Rubella:  immune  RPR:   nonreactive HBsAg:   nonreactive HIV:   negative GC/Chlamydia: negative (12/26/16) GBS:   negative 1-hr Glucola: 118 Genetic screening:  Not done Anatomy US: normal anatomy at 28 weeks, female  Prenatal Transfer Tool  Maternal Diabetes: No Genetic Screening: not done Maternal Ultrasounds/Referrals: Normal Fetal Ultrasounds or other Referrals:  None Maternal Substance Abuse:  No Significant Maternal Medications:  None Significant Maternal Lab Results: Lab values include: Group B Strep  negative  Results for orders placed or performed during the hospital encounter of 04/13/17 (from the past 24 hour(s))  CBC   Collection Time: 04/13/17  7:59 PM  Result Value Ref Range   WBC 12.5 (H) 4.0 - 10.5 K/uL   RBC 4.44 3.87 - 5.11 MIL/uL   Hemoglobin 12.5 12.0 - 15.0 g/dL   HCT 40.937.5 81.136.0 - 91.446.0 %   MCV 84.5 78.0 - 100.0 fL   MCH 28.2 26.0 - 34.0 pg   MCHC 33.3 30.0 - 36.0 g/dL   RDW 78.213.3 95.611.5 - 21.315.5 %   Platelets 160 150 - 400 K/uL    Assessment: Brandy Kaufman is a 26 y.o. G3P2002 at 6946w1d here  for active labor and SROM around 1930. Has hx of C/S x 1, and VBAC x 1. TOLAC consent signed.   #Labor: Expectant management #Pain: Per patient's request #FWB: Cat I #ID:  GBS neg #MOF: breast and bottle  Kandra NicolasJulie P Degele 04/13/2017, 8:37 PM

## 2017-04-13 NOTE — MAU Note (Signed)
Notified Brandy LarryMelanie Kaufman CNM. [facility] [place] [facility] [place] [facility] has not seen a provider.   Presents with contractions every 5 to 10 minutes, cervix 4/80/-3 intact.  Per CNM patient may walk and be rechecked in one hour.

## 2017-04-13 NOTE — Discharge Instructions (Signed)
Braxton Hicks Contractions °Contractions of the uterus can occur throughout pregnancy, but they are not always a sign that you are in labor. You may have practice contractions called Braxton Hicks contractions. These false labor contractions are sometimes confused with true labor. °What are Braxton Hicks contractions? °Braxton Hicks contractions are tightening movements that occur in the muscles of the uterus before labor. Unlike true labor contractions, these contractions do not result in opening (dilation) and thinning of the cervix. Toward the end of pregnancy (32-34 weeks), Braxton Hicks contractions can happen more often and may become stronger. These contractions are sometimes difficult to tell apart from true labor because they can be very uncomfortable. You should not feel embarrassed if you go to the hospital with false labor. °Sometimes, the only way to tell if you are in true labor is for your health care provider to look for changes in the cervix. The health care provider will do a physical exam and may monitor your contractions. If you are not in true labor, the exam should show that your cervix is not dilating and your water has not broken. °If there are other health problems associated with your pregnancy, it is completely safe for you to be sent home with false labor. You may continue to have Braxton Hicks contractions until you go into true labor. °How to tell the difference between true labor and false labor °True labor °· Contractions last 30-70 seconds. °· Contractions become very regular. °· Discomfort is usually felt in the top of the uterus, and it spreads to the lower abdomen and low back. °· Contractions do not go away with walking. °· Contractions usually become more intense and increase in frequency. °· The cervix dilates and gets thinner. °False labor °· Contractions are usually shorter and not as strong as true labor contractions. °· Contractions are usually irregular. °· Contractions  are often felt in the front of the lower abdomen and in the groin. °· Contractions may go away when you walk around or change positions while lying down. °· Contractions get weaker and are shorter-lasting as time goes on. °· The cervix usually does not dilate or become thin. °Follow these instructions at home: °· Take over-the-counter and prescription medicines only as told by your health care provider. °· Keep up with your usual exercises and follow other instructions from your health care provider. °· Eat and drink lightly if you think you are going into labor. °· If Braxton Hicks contractions are making you uncomfortable: °? Change your position from lying down or resting to walking, or change from walking to resting. °? Sit and rest in a tub of warm water. °? Drink enough fluid to keep your urine pale yellow. Dehydration may cause these contractions. °? Do slow and deep breathing several times an hour. °· Keep all follow-up prenatal visits as told by your health care provider. This is important. °Contact a health care provider if: °· You have a fever. °· You have continuous pain in your abdomen. °Get help right away if: °· Your contractions become stronger, more regular, and closer together. °· You have fluid leaking or gushing from your vagina. °· You pass blood-tinged mucus (bloody show). °· You have bleeding from your vagina. °· You have low back pain that you never had before. °· You feel your baby’s head pushing down and causing pelvic pressure. °· Your baby is not moving inside you as much as it used to. °Summary °· Contractions that occur before labor are called Braxton   Hicks contractions, false labor, or practice contractions. °· Braxton Hicks contractions are usually shorter, weaker, farther apart, and less regular than true labor contractions. True labor contractions usually become progressively stronger and regular and they become more frequent. °· Manage discomfort from Braxton Hicks contractions by  changing position, resting in a warm bath, drinking plenty of water, or practicing deep breathing. °This information is not intended to replace advice given to you by your health care provider. Make sure you discuss any questions you have with your health care provider. °Document Released: 08/16/2016 Document Revised: 08/16/2016 Document Reviewed: 08/16/2016 °Elsevier Interactive Patient Education © 2018 [employer] Inc[employer] ° °

## 2017-04-13 NOTE — MAU Note (Signed)
Pt present to MAU with contractions and leaking of fluid since 5 minutes ag

## 2017-04-14 ENCOUNTER — Encounter (HOSPITAL_COMMUNITY)
Admission: AD | Disposition: A | Discharge status: Home / Self Care | Payer: Self-pay | Source: Ambulatory Visit | Attending: Obstetrics and Gynecology

## 2017-04-14 ENCOUNTER — Encounter (HOSPITAL_COMMUNITY): Payer: Self-pay | Admitting: Obstetrics & Gynecology

## 2017-04-14 ENCOUNTER — Inpatient Hospital Stay (HOSPITAL_COMMUNITY): Payer: Medicaid Other | Admitting: Anesthesiology

## 2017-04-14 DIAGNOSIS — Z3A40 40 weeks gestation of pregnancy: Secondary | ICD-10-CM

## 2017-04-14 DIAGNOSIS — Z302 Encounter for sterilization: Secondary | ICD-10-CM

## 2017-04-14 DIAGNOSIS — O480 Post-term pregnancy: Secondary | ICD-10-CM

## 2017-04-14 HISTORY — PX: TUBAL LIGATION: SHX77

## 2017-04-14 LAB — CBC
HEMATOCRIT: 32.5 % — AB (ref 36.0–46.0)
HEMOGLOBIN: 11 g/dL — AB (ref 12.0–15.0)
MCH: 28.4 pg (ref 26.0–34.0)
MCHC: 33.8 g/dL (ref 30.0–36.0)
MCV: 84 fL (ref 78.0–100.0)
Platelets: 144 10*3/uL — ABNORMAL LOW (ref 150–400)
RBC: 3.87 MIL/uL (ref 3.87–5.11)
RDW: 13.2 % (ref 11.5–15.5)
WBC: 16.5 10*3/uL — ABNORMAL HIGH (ref 4.0–10.5)

## 2017-04-14 LAB — RPR: RPR Ser Ql: NONREACTIVE

## 2017-04-14 SURGERY — LIGATION, FALLOPIAN TUBE, POSTPARTUM
Anesthesia: Spinal | Laterality: Bilateral

## 2017-04-14 MED ORDER — SIMETHICONE 80 MG PO CHEW: 80.0000 mg | CHEWABLE_TABLET | ORAL | Status: DC | PRN

## 2017-04-14 MED ORDER — ONDANSETRON HCL 4 MG PO TABS: 4.0000 mg | ORAL_TABLET | ORAL | Status: DC | PRN

## 2017-04-14 MED ORDER — ONDANSETRON HCL 4 MG/2ML IJ SOLN: 4.0000 mg | INTRAMUSCULAR | Status: DC | PRN

## 2017-04-14 MED ORDER — METOCLOPRAMIDE HCL 10 MG PO TABS
10.0000 mg | ORAL_TABLET | Freq: Once | ORAL | Status: AC
  Administered 2017-04-14: 10 mg via ORAL
  Filled 2017-04-14: qty 1

## 2017-04-14 MED ORDER — OXYCODONE HCL 5 MG PO TABS: 5.0000 mg | ORAL_TABLET | Freq: Once | ORAL | Status: DC | PRN

## 2017-04-14 MED ORDER — OXYCODONE-ACETAMINOPHEN 5-325 MG PO TABS: 1.0000 | ORAL_TABLET | ORAL | Status: DC | PRN

## 2017-04-14 MED ORDER — PROMETHAZINE HCL 25 MG/ML IJ SOLN: 6.2500 mg | INTRAMUSCULAR | Status: DC | PRN

## 2017-04-14 MED ORDER — FENTANYL CITRATE (PF) 100 MCG/2ML IJ SOLN
INTRAMUSCULAR | Status: AC
  Filled 2017-04-14: qty 2

## 2017-04-14 MED ORDER — ZOLPIDEM TARTRATE 5 MG PO TABS: 5.0000 mg | ORAL_TABLET | Freq: Every evening | ORAL | Status: DC | PRN

## 2017-04-14 MED ORDER — TRANEXAMIC ACID 1000 MG/10ML IV SOLN
1000.0000 mg | Freq: Once | INTRAVENOUS | Status: AC
  Administered 2017-04-14: 1000 mg via INTRAVENOUS
  Filled 2017-04-14: qty 10

## 2017-04-14 MED ORDER — BUPIVACAINE IN DEXTROSE 0.75-8.25 % IT SOLN
INTRATHECAL | Status: DC | PRN
  Administered 2017-04-14: 1.2 mg via INTRATHECAL

## 2017-04-14 MED ORDER — COCONUT OIL OIL: 1.0000 "application " | TOPICAL_OIL | Status: DC | PRN

## 2017-04-14 MED ORDER — ONDANSETRON HCL 4 MG/2ML IJ SOLN
INTRAMUSCULAR | Status: DC | PRN
  Administered 2017-04-14: 4 mg via INTRAVENOUS

## 2017-04-14 MED ORDER — KETOROLAC TROMETHAMINE 30 MG/ML IJ SOLN
INTRAMUSCULAR | Status: AC
  Filled 2017-04-14: qty 1

## 2017-04-14 MED ORDER — LACTATED RINGERS IV SOLN
INTRAVENOUS | Status: DC | PRN
  Administered 2017-04-14: 11:00:00 via INTRAVENOUS

## 2017-04-14 MED ORDER — FAMOTIDINE 20 MG PO TABS
40.0000 mg | ORAL_TABLET | Freq: Once | ORAL | Status: AC
  Administered 2017-04-14: 40 mg via ORAL
  Filled 2017-04-14: qty 2

## 2017-04-14 MED ORDER — TETANUS-DIPHTH-ACELL PERTUSSIS 5-2.5-18.5 LF-MCG/0.5 IM SUSP: 0.5000 mL | Freq: Once | INTRAMUSCULAR | Status: DC

## 2017-04-14 MED ORDER — PRENATAL MULTIVITAMIN CH
1.0000 | ORAL_TABLET | Freq: Every day | ORAL | Status: DC
  Administered 2017-04-15: 1 via ORAL
  Filled 2017-04-14: qty 1

## 2017-04-14 MED ORDER — ACETAMINOPHEN 325 MG PO TABS
650.0000 mg | ORAL_TABLET | ORAL | Status: DC | PRN
  Administered 2017-04-14 – 2017-04-15 (×3): 650 mg via ORAL
  Filled 2017-04-14 (×3): qty 2

## 2017-04-14 MED ORDER — IBUPROFEN 600 MG PO TABS
600.0000 mg | ORAL_TABLET | Freq: Four times a day (QID) | ORAL | Status: DC
  Administered 2017-04-14 – 2017-04-15 (×3): 600 mg via ORAL
  Filled 2017-04-14 (×5): qty 1

## 2017-04-14 MED ORDER — BUPIVACAINE HCL 0.5 % IJ SOLN
INTRAMUSCULAR | Status: DC | PRN
  Administered 2017-04-14: 30 mL

## 2017-04-14 MED ORDER — OXYCODONE HCL 5 MG/5ML PO SOLN: 5.0000 mg | Freq: Once | ORAL | Status: DC | PRN

## 2017-04-14 MED ORDER — SENNOSIDES-DOCUSATE SODIUM 8.6-50 MG PO TABS
2.0000 | ORAL_TABLET | ORAL | Status: DC
  Filled 2017-04-14: qty 2

## 2017-04-14 MED ORDER — BUPIVACAINE HCL (PF) 0.5 % IJ SOLN
INTRAMUSCULAR | Status: AC
  Filled 2017-04-14: qty 30

## 2017-04-14 MED ORDER — LACTATED RINGERS IV SOLN
INTRAVENOUS | Status: DC
  Administered 2017-04-14: 10:00:00 via INTRAVENOUS

## 2017-04-14 MED ORDER — OXYCODONE HCL 5 MG PO TABS
5.0000 mg | ORAL_TABLET | ORAL | Status: DC | PRN
  Administered 2017-04-14: 5 mg via ORAL
  Filled 2017-04-14: qty 1

## 2017-04-14 MED ORDER — FENTANYL CITRATE (PF) 100 MCG/2ML IJ SOLN
INTRAMUSCULAR | Status: DC | PRN
  Administered 2017-04-14: 10 ug via INTRATHECAL
  Administered 2017-04-14: 40 ug via INTRAVENOUS
  Administered 2017-04-14: 50 ug via INTRAVENOUS

## 2017-04-14 MED ORDER — BENZOCAINE-MENTHOL 20-0.5 % EX AERO: 1.0000 "application " | INHALATION_SPRAY | CUTANEOUS | Status: DC | PRN

## 2017-04-14 MED ORDER — ONDANSETRON HCL 4 MG/2ML IJ SOLN
INTRAMUSCULAR | Status: AC
  Filled 2017-04-14: qty 2

## 2017-04-14 MED ORDER — KETOROLAC TROMETHAMINE 30 MG/ML IJ SOLN
INTRAMUSCULAR | Status: DC | PRN
  Administered 2017-04-14: 30 mg via INTRAVENOUS

## 2017-04-14 MED ORDER — WITCH HAZEL-GLYCERIN EX PADS: 1.0000 "application " | MEDICATED_PAD | CUTANEOUS | Status: DC | PRN

## 2017-04-14 MED ORDER — DIPHENHYDRAMINE HCL 25 MG PO CAPS: 25.0000 mg | ORAL_CAPSULE | Freq: Four times a day (QID) | ORAL | Status: DC | PRN

## 2017-04-14 MED ORDER — HYDROMORPHONE HCL 1 MG/ML IJ SOLN: 0.2500 mg | INTRAMUSCULAR | Status: DC | PRN

## 2017-04-14 MED ORDER — DIBUCAINE 1 % RE OINT: 1.0000 "application " | TOPICAL_OINTMENT | RECTAL | Status: DC | PRN

## 2017-04-14 MED ORDER — LACTATED RINGERS IV SOLN: INTRAVENOUS | Status: DC

## 2017-04-14 SURGICAL SUPPLY — 21 items
BENZOIN TINCTURE PRP APPL 2/3 (GAUZE/BANDAGES/DRESSINGS) ×4 IMPLANT
CLIP FILSHIE TUBAL LIGA STRL (Clip) ×2 IMPLANT
CLOTH BEACON ORANGE TIMEOUT ST (SAFETY) ×2 IMPLANT
DRSG OPSITE POSTOP 3X4 (GAUZE/BANDAGES/DRESSINGS) ×4 IMPLANT
DURAPREP 26ML APPLICATOR (WOUND CARE) ×2 IMPLANT
GLOVE BIOGEL PI IND STRL 7.0 (GLOVE) ×3 IMPLANT
GLOVE BIOGEL PI INDICATOR 7.0 (GLOVE) ×3
GLOVE ECLIPSE 7.0 STRL STRAW (GLOVE) ×2 IMPLANT
GOWN STRL REUS W/TWL LRG LVL3 (GOWN DISPOSABLE) ×4 IMPLANT
GOWN STRL REUS W/TWL XL LVL3 (GOWN DISPOSABLE) ×2 IMPLANT
NEEDLE HYPO 22GX1.5 SAFETY (NEEDLE) ×2 IMPLANT
NS IRRIG 1000ML POUR BTL (IV SOLUTION) ×2 IMPLANT
PACK ABDOMINAL MINOR (CUSTOM PROCEDURE TRAY) ×2 IMPLANT
PROTECTOR NERVE ULNAR (MISCELLANEOUS) ×2 IMPLANT
STRIP CLOSURE SKIN 1/2X4 (GAUZE/BANDAGES/DRESSINGS) ×4 IMPLANT
SUT VIC AB 0 CT1 27 (SUTURE) ×1
SUT VIC AB 0 CT1 27XBRD ANBCTR (SUTURE) ×1 IMPLANT
SUT VIC AB 4-0 PS2 27 (SUTURE) ×2 IMPLANT
SYR CONTROL 10ML LL (SYRINGE) ×2 IMPLANT
TOWEL OR 17X24 6PK STRL BLUE (TOWEL DISPOSABLE) ×4 IMPLANT
TRAY FOLEY CATH 16FR SILVER (SET/KITS/TRAYS/PACK) ×2 IMPLANT

## 2017-04-14 NOTE — Progress Notes (Signed)
POSTPARTUM PROGRESS NOTE  Post Partum Day 1  Subjective:  Brandy Kaufman is a 26 y.o. [id number] s/p SVD at 8615w1d.  No acute events overnight.  Pt denies problems with ambulating, voiding or po intake.  She denies nausea or vomiting.  Pain is well controlled.   Lochia Moderate and decreased from last night.   Objective: Blood pressure 113/60, pulse (!) 58, temperature 99.3 F (37.4 C), temperature source Axillary, resp. rate 18, height 5\' 1"  (1.549 m), weight 195 lb 12 oz (88.8 kg), last menstrual period 07/06/2016, SpO2 99 %, unknown if currently breastfeeding.  Physical Exam:  General: alert, cooperative and no distress Chest: no respiratory distress Heart:regular rate, distal pulses intact Abdomen: soft, nontender,  Uterine Fundus: firm, appropriately tender DVT Evaluation: No calf swelling or tenderness Extremities: no edema Skin: warm, dry  Recent Labs    04/13/17 2338 04/14/17 0508  HGB 12.3 11.0*  HCT 36.6 32.5*    Assessment/Plan: Brandy Kaufman is a 26 y.o. [id number] s/p SVD at 7715w1d. She had poor uterine atony s/p delivery, EBL 860 ml. Dong well now.  PPD#1 - Doing well. Hgb 11.0 from 12.3, asymptomatic. Contraception: BTL (papers signed 11/15) - plan to do it later this morning Feeding: breast and bottle Dispo: Plan for discharge tomorrow.   LOS: 1 day   Kandra NicolasJulie P DegeleMD 04/14/2017, 9:02 AM

## 2017-04-14 NOTE — Lactation Note (Signed)
This note was copied from a baby's chart. Lactation Consultation Note Baby 5 hrs old. Mom's 3rd baby. Mom didn't BF her 1st child now 558 yrs old. Mom BF her 2nd child now 473 yrs old for 3 months. Mom was breast and formula as choice with this baby. Encouraged to BF before giving formula and try to wait 2 weeks d/t stimulation for milk supply.  Mom very sleepy. Wants to  Pump to help w/bleeding, also for lactation induction. Mom shown how to use DEBP & how to disassemble, clean, & reassemble parts. Mom knows to pump q3h for 15-20 min. Mom encouraged to feed baby 8-12 times/24 hours and with feeding cues. Newborn feeding habits, behavior, STS, cluster feeding, I&O, supply and demand reviewed.  Encouraged to call for assistance if needed for latching.  [facility] brochure given w/resources, support groups and LC services. Mom speaks good Spanish, denied need for interpreter  Patient Name: Brandy Doyne KeelDulce Hernandez-Vazquez ZOXWR'UToday's Date: 04/14/2017 Reason for consult: Initial assessment;Other (Comment)(PPH)   Maternal Data Has patient been taught Hand Expression?: Yes Does the patient have breastfeeding experience prior to this delivery?: Yes  Feeding Feeding Type: Breast Fed Length of feed: 5 min  LATCH Score Latch: Repeated attempts needed to sustain latch, nipple held in mouth throughout feeding, stimulation needed to elicit sucking reflex.  Audible Swallowing: A few with stimulation  Type of Nipple: Everted at rest and after stimulation  Comfort (Breast/Nipple): Soft / non-tender  Hold (Positioning): Full assist, staff holds infant at breast  LATCH Score: 6  Interventions Interventions: Breast feeding basics reviewed;Breast compression;DEBP;Support pillows;Position options;Breast massage;Hand express;Pre-pump if needed  Lactation Tools Discussed/Used Tools: Pump Breast pump type: Double-Electric Breast Pump WIC Program: Yes Pump Review: Setup, frequency, and cleaning;Milk  Storage Initiated by:: Peri JeffersonL. Kambry Takacs RN IBCLC Date initiated:: 04/14/17   Consult Status Consult Status: Follow-up Date: 04/14/17 Follow-up type: In-patient    Charyl DancerCARVER, Verma Grothaus G 04/14/2017, 3:47 AM

## 2017-04-14 NOTE — Transfer of Care (Signed)
Immediate Anesthesia Transfer of Care Note  Patient: Brandy HowellsDulce M Hernandez-Vazquez  Procedure(s) Performed: POST PARTUM TUBAL LIGATION (Bilateral )  Patient Location: PACU  Anesthesia Type:Spinal  Level of Consciousness: awake and alert   Airway & Oxygen Therapy: Patient Spontanous Breathing  Post-op Assessment: Report given to RN and Post -op Vital signs reviewed and stable  Post vital signs: Reviewed  Last Vitals:  Vitals:   04/14/17 1012 04/14/17 1138  BP: (!) 107/53 (!) 99/55  Pulse: 62 (!) 58  Resp: 18 12  Temp: 36.9 C   SpO2: 98% 97%    Last Pain:  Vitals:   04/14/17 1012  TempSrc: Oral  PainSc:       Patients Stated Pain Goal: 2 (04/13/17 2100)  Complications: No apparent anesthesia complications

## 2017-04-14 NOTE — Anesthesia Postprocedure Evaluation (Signed)
Anesthesia Post Note  Patient: Brandy HowellsDulce M Kaufman  Procedure(s) Performed: POST PARTUM TUBAL LIGATION (Bilateral )     Patient location during evaluation: Mother Baby Anesthesia Type: Spinal Level of consciousness: awake Pain management: pain level controlled Vital Signs Assessment: post-procedure vital signs reviewed and stable Cardiovascular status: stable Postop Assessment: no headache, no backache, spinal receding, patient able to bend at knees, no apparent nausea or vomiting and adequate PO intake Anesthetic complications: no    Last Vitals:  Vitals:   04/14/17 1247 04/14/17 1400  BP: (!) 94/53 (!) 95/56  Pulse: (!) 53 (!) 53  Resp: 18 18  Temp: 36.8 C 36.8 C  SpO2: 99% 99%    Last Pain:  Vitals:   04/14/17 1248  TempSrc:   PainSc: 5    Pain Goal: Patients Stated Pain Goal: 2 (04/13/17 2100)               Fanny DanceMULLINS,[name]

## 2017-04-14 NOTE — Op Note (Signed)
Brandy HowellsDulce M Hernandez-Vazquez 04/13/2017 - 04/14/2017  PREOPERATIVE DIAGNOSIS:  Undesired fertility  POSTOPERATIVE DIAGNOSIS:  Undesired fertility  PROCEDURE:  Postpartum Bilateral Tubal Sterilization using Filshie Clips   SURGEON: Surgeon(s) and Role:    * Willodean RosenthalHarraway-Smith, Carolyn, MD - Primary    * Patryck Kilgore, Kandra NicolasJulie P, MD - Assisting -- OB Fellow  ANESTHESIA:  Epidural  COMPLICATIONS:  None immediate.  ESTIMATED BLOOD LOSS:  10 mL   FLUIDS: 600 cc LR.  URINE OUTPUT:  700 cc of clear urine.  INDICATIONS: 26 y.o. yo [phone]  with undesired fertility,status post vaginal delivery, desires permanent sterilization. Risks and benefits of procedure discussed with patient including permanence of method, bleeding, infection, injury to surrounding organs and need for additional procedures. Risk failure of 0.5-1% with increased risk of ectopic gestation if pregnancy occurs was also discussed with patient.   FINDINGS:  Normal uterus, tubes, and ovaries.  TECHNIQUE:  The patient was taken to the operating room where her epidural anesthesia was dosed up to surgical level and found to be adequate.  She was then placed in the dorsal supine position and prepped and draped in sterile fashion.  After an adequate timeout was performed, attention was turned to the patient's abdomen where a small transverse skin incision was made under the umbilical fold. The incision was taken down to the layer of fascia using Mayo scissors, and fascia was incised, and extended bilaterally using Mayo scissors. The peritoneum was entered in a sharp fashion. Attention was then turned to the patient's uterus, and right fallopian tube was identified and followed out to the fimbriated end.  A Filshie clip was placed on the left fallopian tube about 3 cm from the cornual attachment, with care given to incorporate the underlying mesosalpinx.  A similar process was carried out on the left side allowing for bilateral tubal sterilization.  Good  hemostasis was noted overall. The instruments were then removed from the patient's abdomen and the fascial incision was repaired with 0 Vicryl, and the skin was closed with a 4-0 Vicryl subcuticular stitch. 30cc of 0.5% Marcaine was injected into the incision. The patient tolerated the procedure well.  Instrument, sponge, and needle counts were correct times two.  The patient was then taken to the recovery room awake and in stable condition.  Yashira Offenberger, Kandra NicolasJulie P, MD OB Fellow 04/14/2017 11:33 AM

## 2017-04-14 NOTE — Anesthesia Preprocedure Evaluation (Addendum)
Anesthesia Evaluation  Patient identified by MRN, date of birth, ID band Patient awake    Reviewed: Allergy & Precautions, NPO status , Patient's Chart, lab work & pertinent test results  Airway Mallampati: III  TM Distance: >3 FB Neck ROM: Full    Dental no notable dental hx.    Pulmonary neg pulmonary ROS,    Pulmonary exam normal breath sounds clear to auscultation       Cardiovascular negative cardio ROS Normal cardiovascular exam Rhythm:Regular Rate:Normal     Neuro/Psych PSYCHIATRIC DISORDERS Depression negative neurological ROS     GI/Hepatic negative GI ROS, Neg liver ROS,   Endo/Other  negative endocrine ROS  Renal/GU negative Renal ROS     Musculoskeletal negative musculoskeletal ROS (+)   Abdominal (+) + obese,   Peds  Hematology  (+) anemia ,   Anesthesia Other Findings desires sterilization  Reproductive/Obstetrics                            Anesthesia Physical Anesthesia Plan  ASA: III  Anesthesia Plan: Spinal   Post-op Pain Management:    Induction: Intravenous  PONV Risk Score and Plan: 2 and Treatment may vary due to age or medical condition  Airway Management Planned: Natural Airway  Additional Equipment:   Intra-op Plan:   Post-operative Plan:   Informed Consent: I have reviewed the patients History and Physical, chart, labs and discussed the procedure including the risks, benefits and alternatives for the proposed anesthesia with the patient or authorized representative who has indicated his/her understanding and acceptance.   Dental advisory given  Plan Discussed with: CRNA  Anesthesia Plan Comments:        Anesthesia Quick Evaluation

## 2017-04-14 NOTE — Anesthesia Procedure Notes (Signed)
Spinal  Patient location during procedure: OR Start time: 04/14/2017 10:40 AM End time: 04/14/2017 10:50 AM Staffing Anesthesiologist: Leonides GrillsEllender, Moriyah Byington P, MD Performed: anesthesiologist  Preanesthetic Checklist Completed: patient identified, surgical consent, pre-op evaluation, timeout performed, IV checked, risks and benefits discussed and monitors and equipment checked Spinal Block Patient position: sitting Prep: DuraPrep Patient monitoring: cardiac monitor, continuous pulse ox and blood pressure Approach: midline Location: L4-5 Injection technique: single-shot Needle Needle type: Pencan  Needle gauge: 24 G Needle length: 9 cm Assessment Sensory level: T10 Additional Notes Functioning IV was confirmed and monitors were applied. Sterile prep and drape, including hand hygiene and sterile gloves were used. The patient was positioned and the spine was prepped. The skin was anesthetized with lidocaine.  Free flow of clear CSF was obtained prior to injecting local anesthetic into the CSF.  The spinal needle aspirated freely following injection.  The needle was carefully withdrawn.  The patient tolerated the procedure well.

## 2017-04-14 NOTE — Progress Notes (Signed)
Spoke to J. Degele about time of BTL for patient. Was told  Dr Vergie LivingPickens will determine when he comes in in A.M.

## 2017-04-14 NOTE — Progress Notes (Signed)
CSW received consult for hx of Anxiety and Depression.  CSW met with MOB to offer support and complete assessment.   MOB was very pleasant and welcoming to CSW's visit, however, appeared very tired.  CSW offered to return at a later time, but MOB stated now was fine.  She states she and baby are doing well and that she feels she has a good support system in her boyfriend/FOB-Alejandro and her mother.  She reports feeling well emotionally at this time.  She states that she and Cletus Gash have a 80 year old daughter together and that she also has an 53 year old daughter from a previous relationship.  She denies PMADs, but states she has experienced depression in the past, outside of the postpartum time period.  She states she received counseling at [facility] and states she could return if needed.  She told CSW that her uncle is a priest and that she finds it very helpful to talk with him when she needs emotional support.  MOB describes her relationship with FOB as positive and supportive, but information CSW that they were having a period of "ups and downs" during the pregnancy and both realized that they needed some space from each other.  She reports that they have been in a relationship for 5 years.  She states she went to live with her mother for a period of time while they worked things out.  She feels this was beneficial for both of them and that they have gotten back together and that things are back to being positive again.  MOB seems happy when she talks about her current relationship with FOB.  She reports that they have everything they need for baby at home and no current questions, concerns or needs of CSW. CSW provided education regarding the baby blues period vs. perinatal mood disorders, discussed treatment and gave resources for mental health follow up if concerns arise.  CSW recommends self-evaluation during the postpartum time period using the New Mom Checklist from Postpartum Progress and  encouraged MOB to contact a medical professional if symptoms are noted at any time.   CSW provided review of Sudden Infant Death Syndrome (SIDS) precautions, of which MOB was not previously aware.   CSW identifies no further need for intervention and no barriers to discharge at this time.

## 2017-04-15 DIAGNOSIS — Z9851 Tubal ligation status: Secondary | ICD-10-CM

## 2017-04-15 LAB — CBC
HCT: 28.3 % — ABNORMAL LOW (ref 36.0–46.0)
Hemoglobin: 9.4 g/dL — ABNORMAL LOW (ref 12.0–15.0)
MCH: 28.2 pg (ref 26.0–34.0)
MCHC: 33.2 g/dL (ref 30.0–36.0)
MCV: 85 fL (ref 78.0–100.0)
PLATELETS: 133 10*3/uL — AB (ref 150–400)
RBC: 3.33 MIL/uL — AB (ref 3.87–5.11)
RDW: 13.4 % (ref 11.5–15.5)
WBC: 6.6 10*3/uL (ref 4.0–10.5)

## 2017-04-15 MED ORDER — IBUPROFEN 600 MG PO TABS: 600.0000 mg | ORAL_TABLET | Freq: Four times a day (QID) | ORAL | 0 refills | Status: DC

## 2017-04-15 NOTE — Discharge Instructions (Signed)
Vaginal Delivery, Care After °Refer to this sheet in the next few weeks. These instructions provide you with information about caring for yourself after vaginal delivery. Your health care provider may also give you more specific instructions. Your treatment has been planned according to current medical practices, but problems sometimes occur. Call your health care provider if you have any problems or questions. °What can I expect after the procedure? °After vaginal delivery, it is common to have: °· Some bleeding from your vagina. °· Soreness in your abdomen, your vagina, and the area of skin between your vaginal opening and your anus (perineum). °· Pelvic cramps. °· Fatigue. ° °Follow these instructions at home: °Medicines °· Take over-the-counter and prescription medicines only as told by your health care provider. °· If you were prescribed an antibiotic medicine, take it as told by your health care provider. Do not stop taking the antibiotic until it is finished. °Driving ° °· Do not drive or operate heavy machinery while taking prescription pain medicine. °· Do not drive for 24 hours if you received a sedative. °Lifestyle °· Do not drink alcohol. This is especially important if you are breastfeeding or taking medicine to relieve pain. °· Do not use tobacco products, including cigarettes, chewing tobacco, or e-cigarettes. If you need help quitting, ask your health care provider. °Eating and drinking °· Drink at least 8 eight-ounce glasses of water every day unless you are told not to by your health care provider. If you choose to breastfeed your baby, you may need to drink more water than this. °· Eat high-fiber foods every day. These foods may help prevent or relieve constipation. High-fiber foods include: °? Whole grain cereals and breads. °? Brown rice. °? Beans. °? Fresh fruits and vegetables. °Activity °· Return to your normal activities as told by your health care provider. Ask your health care provider  what activities are safe for you. °· Rest as much as possible. Try to rest or take a nap when your baby is sleeping. °· Do not lift anything that is heavier than your baby or 10 lb (4.5 kg) until your health care provider says that it is safe. °· Talk with your health care provider about when you can engage in sexual activity. This may depend on your: °? Risk of infection. °? Rate of healing. °? Comfort and desire to engage in sexual activity. °Vaginal Care °· If you have an episiotomy or a vaginal tear, check the area every day for signs of infection. Check for: °? More redness, swelling, or pain. °? More fluid or blood. °? Warmth. °? Pus or a bad smell. °· Do not use tampons or douches until your health care provider says this is safe. °· Watch for any blood clots that may pass from your vagina. These may look like clumps of dark red, brown, or black discharge. °General instructions °· Keep your perineum clean and dry as told by your health care provider. °· Wear loose, comfortable clothing. °· Wipe from front to back when you use the toilet. °· Ask your health care provider if you can shower or take a bath. If you had an episiotomy or a perineal tear during labor and delivery, your health care provider may tell you not to take baths for a certain length of time. °· Wear a bra that supports your breasts and fits you well. °· If possible, have someone help you with household activities and help care for your baby for at least a few days after   you leave the hospital. °· Keep all follow-up visits for you and your baby as told by your health care provider. This is important. °Contact a health care provider if: °· You have: °? Vaginal discharge that has a bad smell. °? Difficulty urinating. °? Pain when urinating. °? A sudden increase or decrease in the frequency of your bowel movements. °? More redness, swelling, or pain around your episiotomy or vaginal tear. °? More fluid or blood coming from your episiotomy or  vaginal tear. °? Pus or a bad smell coming from your episiotomy or vaginal tear. °? A fever. °? A rash. °? Little or no interest in activities you used to enjoy. °? Questions about caring for yourself or your baby. °· Your episiotomy or vaginal tear feels warm to the touch. °· Your episiotomy or vaginal tear is separating or does not appear to be healing. °· Your breasts are painful, hard, or turn red. °· You feel unusually sad or worried. °· You feel nauseous or you vomit. °· You pass large blood clots from your vagina. If you pass a blood clot from your vagina, save it to show to your health care provider. Do not flush blood clots down the toilet without having your health care provider look at them. °· You urinate more than usual. °· You are dizzy or light-headed. °· You have not breastfed at all and you have not had a menstrual period for 12 weeks after delivery. °· You have stopped breastfeeding and you have not had a menstrual period for 12 weeks after you stopped breastfeeding. °Get help right away if: °· You have: °? Pain that does not go away or does not get better with medicine. °? Chest pain. °? Difficulty breathing. °? Blurred vision or spots in your vision. °? Thoughts about hurting yourself or your baby. °· You develop pain in your abdomen or in one of your legs. °· You develop a severe headache. °· You faint. °· You bleed from your vagina so much that you fill two sanitary pads in one hour. °This information is not intended to replace advice given to you by your health care provider. Make sure you discuss any questions you have with your health care provider. °Document Released: 03/30/2000 Document Revised: 09/14/2015 Document Reviewed: 04/17/2015 °Elsevier Interactive Patient Education © 2018 [employer] ° ° °Postpartum Tubal Ligation, Care After °Refer to this sheet in the next few weeks. These instructions provide you with information about caring for yourself after your procedure. Your health  care provider may also give you more specific instructions. Your treatment has been planned according to current medical practices, but problems sometimes occur. Call your health care provider if you have any problems or questions after your procedure. °What can I expect after the procedure? °After the procedure, it is common to have: °· A sore throat. °· Bruising or pain in your back. °· Nausea or vomiting. °· Dizziness. °· Mild abdominal discomfort or pain, such as cramping, gas pain, or feeling bloated. °· Soreness where the incision was made. °· Tiredness. °· Pain in your shoulders. ° °Follow these instructions at home: °Medicines °· Take over-the-counter and prescription medicines only as told by your health care provider. °· Do not take aspirin because it can cause bleeding. °· Do not drive or operate heavy machinery while taking prescription pain medicine. °Activity °· Rest for the rest of the day. °· Gradually return to your normal activities over the next few days. °· Do not have sex, douche,   or put a tampon or anything else in your vagina for 6 weeks or as long as told by your health care provider. °· Do not lift anything that is heavier than your baby for 2 weeks or as long as told by your health care provider. °Incision care °· Follow instructions from your health care provider about how to take care of your incision. Make sure you: °? Wash your hands with soap and water before you change your bandage (dressing). If soap and water are not available, use hand sanitizer. °? Change your dressing as told by your health care provider. °? Leave stitches (sutures) in place. They may need to stay in place for 2 weeks or longer. °· Check your incision area every day for signs of infection. Check for: °? More redness, swelling, or pain. °? More fluid or blood. °? Warmth. °? Pus or a bad smell. °Other Instructions °· Do not take baths, swim, or use a hot tub until your health care provider approves. You may take  showers. °· Keep all follow-up visits as told by your health care provider. This is important. °Contact a health care provider if: °· You have more redness, swelling, or pain around your incision. °· Your incision feels warm to the touch. °· You have pus or a bad smell coming from your incision. °· The edges of your incision break open after the sutures have been removed. °· Your pain does not improve after 2-3 days. °· You have a rash. °· You repeatedly become dizzy or lightheaded. °· Your pain medicine is not helping. °· You are constipated. °Get help right away if: °· You have a fever. °· You faint. °· You have pain in your abdomen that gets worse. °· You have fluid or blood coming from your sutures. °· You have shortness of breath or difficulty breathing. °· You have chest pain or leg pain. °· You have ongoing nausea or diarrhea. °This information is not intended to replace advice given to you by your health care provider. Make sure you discuss any questions you have with your health care provider. °Document Released: 10/02/2011 Document Revised: 09/05/2015 Document Reviewed: 03/13/2015 °Elsevier Interactive Patient Education © 2018 Elsevier Inc. ° °

## 2017-04-15 NOTE — Lactation Note (Addendum)
This note was copied from a baby's chart. Lactation Consultation Note  Patient Name: Brandy Kaufman BJYNW'GToday's Date: 04/15/2017 Reason for consult: Follow-up assessment   Follow up with mom of 39 hour old infant. Infant with 5 BF for 10-20 minutes, formula x 8 of 2-30 cc, 3 voids and 1 stool in the last 24 hours. Infant weight 7 lb 2.3 oz with weight loss of 2% since birth.   Mom reports she is giving formula as she does not have any milk, reviewed supply and demand and milk coming to volume. Enc mom to put infant to the breast prior to giving formula. Mom denies difficulty with latch and nipple pain with feedings.   Mom has a DEBP set up, she reports she pumped in the beginning and got some colostrum, she has not pumped recently. Mom was given a manual pump to use at home as needed.   Reviewed I/O, Engorgement prevention/treatment, signs of dehydration in the infant and breast milk expression and storage.   Mom reports she has no questions/concerns at this time. Mom to call with any questions/concerns prn.   Manual pump given with instructions for use and cleaning.    Maternal Data Formula Feeding for Exclusion: Yes Reason for exclusion: Mother's choice to formula and breast feed on admission Has patient been taught Hand Expression?: Yes Does the patient have breastfeeding experience prior to this delivery?: Yes  Feeding Feeding Type: Formula Nipple Type: Slow - flow  LATCH Score                   Interventions    Lactation Tools Discussed/Used WIC Program: Yes Pump Review: Setup, frequency, and cleaning;Milk Storage   Consult Status Consult Status: Complete Follow-up type: Call as needed    Ed BlalockSharon S Hice 04/15/2017, 1:35 PM

## 2017-04-15 NOTE — Progress Notes (Signed)
POSTPARTUM PROGRESS NOTE  Post Partum Day 2  Subjective:  Brandy Kaufman is a 26 y.o. [phone] s/p SVD at 148w1d, and POD#1 from ppBTL.  No acute events overnight. Pain is well controlled.  Lochia is much decreased. She endorses some lightheadedness with ambulation.   Objective: Blood pressure (!) 98/54, pulse (!) 52, temperature 98.5 F (36.9 C), temperature source Oral, resp. rate 16, height 5\' 1"  (1.549 m), weight 195 lb 12 oz (88.8 kg), last menstrual period 07/06/2016, SpO2 96 %, unknown if currently breastfeeding.  Physical Exam:  General: alert, cooperative and no distress Chest: no respiratory distress Heart:regular rate, distal pulses intact Abdomen: soft, nontender,  Uterine Fundus: firm, appropriately tender DVT Evaluation: No calf swelling or tenderness Extremities: no edema Skin: warm, dry; Honeycomb dressing on mid-abdomen with small amount of dry blood.   Recent Labs    04/13/17 2338 04/14/17 0508  HGB 12.3 11.0*  HCT 36.6 32.5*    Assessment/Plan: Brandy Kaufman is a 26 y.o. [id number] s/p SVD at 808w1d. She had poor uterine atony s/p delivery, EBL 860 ml. S/p BTL yesterday.  PPD#2 SVD/ POD#1 ppBTL: Doing well overall, but BPs soft and pt endorsing lightheadedness with ambulation.  --Check orthostatic VS --Recheck CBC Contraception: s/p ppTBL Feeding: breast and bottle Dispo: Plan for discharge later today if patient feeling well and ambulating w/o issues, and pending CBC   LOS: 2 days   Kandra NicolasJulie P DegeleMD 04/15/2017, 6:38 AM

## 2017-04-15 NOTE — Discharge Summary (Signed)
OB Discharge Summary     Patient Name: Brandy Kaufman DOB: 1991/01/28 MRN: [id number] Date of admission: 04/13/2017  Delivering MD: Frederik PearEGELE, JULIE P )  Date of discharge: 04/15/2017    Admitting diagnosis: labor Intrauterine pregnancy: 4757w1d    Secondary diagnosis:  Active Problems:   Patient Active Problem List   Diagnosis Date Noted  . Status post tubal ligation 04/15/2017  . Indication for care in labor and delivery, antepartum 04/13/2017  . SVD (spontaneous vaginal delivery) 04/13/2017  . Adjustment disorder with mixed disturbance of emotions and conduct 04/18/2015  . MDD (major depressive disorder) 02/12/2015  . Postpartum depression 01/27/2014  . VBAC, delivered, current hospitalization 12/13/2013  . Abnormal Pap smear of cervix 06/08/2013  . Vaginal septum affecting pregnancy 06/08/2013  . Previous cesarean section 06/01/2013    Additional problems: none     Discharge diagnosis: Term Pregnancy Delivered                                                                                                Post partum procedures: BTL, postpartum hemorrhage  Complications: Hemorrhage>109600mL  Hospital course:  Onset of Labor With Vaginal Delivery     26 y.o. yo G3P3003 at 3657w1d was admitted in Active Labor on 04/13/2017. Patient had an uncomplicated labor course as follows:  Membrane Rupture Time/Date: 7:40 PM ,04/13/2017   Intrapartum Procedures: Episiotomy: None [1]                                         Lacerations:  None [1]  Patient had a delivery of a Viable infant. 04/13/2017  Information for the patient's newborn:  Julien GirtHernandez-Vazquez, Boy Naiyah [[id number]][[id number]]  Delivery Method: Vaginal, Spontaneous(Filed from Delivery Summary)   Postpartum hemmorhage due to uterine atony- given cytotec, methergine, and hemabate, and TXA. Hgb stable at 11 and patient asymptomatic.  Pateint had an uncomplicated postpartum course.  She is ambulating, tolerating a regular diet,  passing flatus, and urinating well. Patient is discharged home in stable condition on 04/15/17.   Physical exam  Vitals:   04/15/17 0526 04/15/17 1000  BP: (!) 98/54 104/60  Pulse: (!) 52 (!) 55  Resp: 16   Temp: 98.5 F (36.9 C) 98.6 F (37 C)  SpO2:      General: alert, cooperative and no distress Lochia: appropriate Uterine Fundus: firm Incision: N/A DVT Evaluation: No evidence of DVT seen on physical exam.  Labs: Results for orders placed or performed during the hospital encounter of 04/13/17 (from the past 24 hour(s))  CBC     Status: Abnormal   Collection Time: 04/15/17  6:58 AM  Result Value Ref Range   WBC 6.6 4.0 - 10.5 K/uL   RBC 3.33 (L) 3.87 - 5.11 MIL/uL   Hemoglobin 9.4 (L) 12.0 - 15.0 g/dL   HCT 13.228.3 (L) 44.036.0 - 10.246.0 %   MCV 85.0 78.0 - 100.0 fL   MCH 28.2 26.0 - 34.0 pg   MCHC 33.2 30.0 - 36.0 g/dL   RDW 13.4  11.5 - 15.5 %   Platelets 133 (L) 150 - 400 K/uL     Discharge instruction: per After Visit Summary and "Baby and Me Booklet".  After visit meds:  No Known Allergies  Allergies as of 04/15/2017   No Known Allergies     Medication List    TAKE these medications   ibuprofen 600 MG tablet Commonly known as:  ADVIL,MOTRIN Take 1 tablet (600 mg total) by mouth every 6 (six) hours.   prenatal multivitamin Tabs tablet Take 1 tablet by mouth daily at 12 noon.        Diet: routine diet  Activity: Advance as tolerated. Pelvic rest for 6 weeks.   Outpatient follow up:4 weeks Future Appointments: No future appointments.  Follow up Appt: No Follow-up on file.     Postpartum contraception: Tubal Ligation  Newborn Data: APGAR (1 MIN): 9   APGAR (5 MINS): 9   APGAR (10 MINS):   @[id number] @   Baby Feeding: Bottle and Breast Disposition:home with mother  Rolm Bookbindermber Ameen Mostafa, DO  04/15/2017

## 2017-10-26 ENCOUNTER — Emergency Department (HOSPITAL_COMMUNITY)
Admission: EM | Admit: 2017-10-26 | Discharge: 2017-10-26 | Disposition: A | Discharge status: Home / Self Care | Payer: Self-pay | Attending: Emergency Medicine | Admitting: Emergency Medicine

## 2017-10-26 ENCOUNTER — Encounter (HOSPITAL_COMMUNITY): Payer: Self-pay | Admitting: Emergency Medicine

## 2017-10-26 ENCOUNTER — Emergency Department (HOSPITAL_COMMUNITY): Payer: Self-pay

## 2017-10-26 DIAGNOSIS — Y998 Other external cause status: Secondary | ICD-10-CM | POA: Insufficient documentation

## 2017-10-26 DIAGNOSIS — Y939 Activity, unspecified: Secondary | ICD-10-CM | POA: Insufficient documentation

## 2017-10-26 DIAGNOSIS — S0592XA Unspecified injury of left eye and orbit, initial encounter: Secondary | ICD-10-CM

## 2017-10-26 DIAGNOSIS — W208XXA Other cause of strike by thrown, projected or falling object, initial encounter: Secondary | ICD-10-CM | POA: Insufficient documentation

## 2017-10-26 DIAGNOSIS — Z23 Encounter for immunization: Secondary | ICD-10-CM | POA: Insufficient documentation

## 2017-10-26 DIAGNOSIS — Y9289 Other specified places as the place of occurrence of the external cause: Secondary | ICD-10-CM | POA: Insufficient documentation

## 2017-10-26 DIAGNOSIS — H1132 Conjunctival hemorrhage, left eye: Secondary | ICD-10-CM | POA: Insufficient documentation

## 2017-10-26 LAB — POC URINE PREG, ED: Preg Test, Ur: NEGATIVE

## 2017-10-26 MED ORDER — OXYCODONE-ACETAMINOPHEN 5-325 MG PO TABS
1.0000 | ORAL_TABLET | Freq: Once | ORAL | Status: AC
  Administered 2017-10-26: 1 via ORAL
  Filled 2017-10-26: qty 1

## 2017-10-26 MED ORDER — TETANUS-DIPHTH-ACELL PERTUSSIS 5-2.5-18.5 LF-MCG/0.5 IM SUSP
0.5000 mL | Freq: Once | INTRAMUSCULAR | Status: AC
Start: 2017-10-26 — End: 2017-10-26
  Administered 2017-10-26: 0.5 mL via INTRAMUSCULAR
  Filled 2017-10-26: qty 0.5

## 2017-10-26 NOTE — ED Notes (Signed)
Pt requesting pain meds
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
## 2017-10-26 NOTE — ED Notes (Signed)
Pt states she wears glasses.
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
## 2017-10-26 NOTE — ED Notes (Signed)
Patient transported to X-ray 
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
## 2017-10-26 NOTE — Discharge Instructions (Signed)
Alternate 600 mg of ibuprofen and [id number] mg of Tylenol every 3 hours as needed for pain. Do not exceed 4000 mg of Tylenol daily.  Apply ice for comfort, 20 minutes on 20 minutes off.  Call Dr. Benard RinkMincey's office on Monday to set up a follow-up appointment within 3 to 5 days of being seen today.  Return to the emergency department immediately for any concerning signs or symptoms develop such as loss of vision, worsening pain with eye movements or inability to move the eye, severe headaches, or fevers.

## 2019-02-07 ENCOUNTER — Other Ambulatory Visit: Payer: Self-pay

## 2019-02-07 ENCOUNTER — Encounter (HOSPITAL_COMMUNITY): Payer: Self-pay

## 2019-02-07 ENCOUNTER — Inpatient Hospital Stay (HOSPITAL_COMMUNITY)
Admission: EM | Admit: 2019-02-07 | Discharge: 2019-02-10 | DRG: 419 | Disposition: A | Discharge status: Home / Self Care | Payer: Medicaid Other | Attending: Family Medicine | Admitting: Family Medicine

## 2019-02-07 ENCOUNTER — Emergency Department (HOSPITAL_COMMUNITY): Payer: Medicaid Other

## 2019-02-07 DIAGNOSIS — Z833 Family history of diabetes mellitus: Secondary | ICD-10-CM | POA: Diagnosis not present

## 2019-02-07 DIAGNOSIS — R1010 Upper abdominal pain, unspecified: Secondary | ICD-10-CM | POA: Diagnosis present

## 2019-02-07 DIAGNOSIS — R1013 Epigastric pain: Secondary | ICD-10-CM | POA: Diagnosis not present

## 2019-02-07 DIAGNOSIS — Z98891 History of uterine scar from previous surgery: Secondary | ICD-10-CM

## 2019-02-07 DIAGNOSIS — R3129 Other microscopic hematuria: Secondary | ICD-10-CM | POA: Diagnosis present

## 2019-02-07 DIAGNOSIS — K8050 Calculus of bile duct without cholangitis or cholecystitis without obstruction: Secondary | ICD-10-CM | POA: Insufficient documentation

## 2019-02-07 DIAGNOSIS — Z9851 Tubal ligation status: Secondary | ICD-10-CM | POA: Diagnosis not present

## 2019-02-07 DIAGNOSIS — Z6833 Body mass index (BMI) 33.0-33.9, adult: Secondary | ICD-10-CM

## 2019-02-07 DIAGNOSIS — K8000 Calculus of gallbladder with acute cholecystitis without obstruction: Secondary | ICD-10-CM | POA: Diagnosis present

## 2019-02-07 DIAGNOSIS — F329 Major depressive disorder, single episode, unspecified: Secondary | ICD-10-CM | POA: Diagnosis present

## 2019-02-07 DIAGNOSIS — K819 Cholecystitis, unspecified: Secondary | ICD-10-CM | POA: Diagnosis present

## 2019-02-07 DIAGNOSIS — Z20828 Contact with and (suspected) exposure to other viral communicable diseases: Secondary | ICD-10-CM | POA: Diagnosis present

## 2019-02-07 DIAGNOSIS — F32A Depression, unspecified: Secondary | ICD-10-CM

## 2019-02-07 LAB — URINALYSIS, ROUTINE W REFLEX MICROSCOPIC
Bilirubin Urine: NEGATIVE
Glucose, UA: NEGATIVE mg/dL
Ketones, ur: NEGATIVE mg/dL
Leukocytes,Ua: NEGATIVE
Nitrite: NEGATIVE
Protein, ur: NEGATIVE mg/dL
Specific Gravity, Urine: >1.03 — ABNORMAL HIGH (ref 1.005–1.030)
pH: 5.5 (ref 5.0–8.0)

## 2019-02-07 LAB — COMPREHENSIVE METABOLIC PANEL
ALT: 11 U/L (ref 0–44)
AST: 16 U/L (ref 15–41)
Albumin: 4.1 g/dL (ref 3.5–5.0)
Alkaline Phosphatase: 49 U/L (ref 38–126)
Anion gap: 10 (ref 5–15)
BUN: 8 mg/dL (ref 6–20)
CO2: 24 mmol/L (ref 22–32)
Calcium: 8.7 mg/dL — ABNORMAL LOW (ref 8.9–10.3)
Chloride: 107 mmol/L (ref 98–111)
Creatinine, Ser: 0.64 mg/dL (ref 0.44–1.00)
GFR calc Af Amer: >60 mL/min (ref >60)
GFR calc non Af Amer: >60 mL/min (ref >60)
Glucose, Bld: 93 mg/dL (ref 70–99)
Potassium: 3.8 mmol/L (ref 3.5–5.1)
Sodium: 141 mmol/L (ref 135–145)
Total Bilirubin: 0.5 mg/dL (ref 0.3–1.2)
Total Protein: 7.3 g/dL (ref 6.5–8.1)

## 2019-02-07 LAB — CBC
HCT: 38.9 % (ref 36.0–46.0)
Hemoglobin: 12.4 g/dL (ref 12.0–15.0)
MCH: 26.9 pg (ref 26.0–34.0)
MCHC: 31.9 g/dL (ref 30.0–36.0)
MCV: 84.4 fL (ref 80.0–100.0)
Platelets: 231 10*3/uL (ref 150–400)
RBC: 4.61 MIL/uL (ref 3.87–5.11)
RDW: 13.7 % (ref 11.5–15.5)
WBC: 6.8 10*3/uL (ref 4.0–10.5)
nRBC: 0 % (ref 0.0–0.2)

## 2019-02-07 LAB — URINALYSIS, MICROSCOPIC (REFLEX): Bacteria, UA: NONE SEEN

## 2019-02-07 LAB — LIPASE, BLOOD: Lipase: 25 U/L (ref 11–51)

## 2019-02-07 LAB — SARS CORONAVIRUS 2 BY RT PCR (HOSPITAL ORDER, PERFORMED IN ~~LOC~~ HOSPITAL LAB): SARS Coronavirus 2: NEGATIVE

## 2019-02-07 LAB — I-STAT BETA HCG BLOOD, ED (MC, WL, AP ONLY): I-stat hCG, quantitative: <5 m[IU]/mL (ref <5)

## 2019-02-07 LAB — HEMOGLOBIN A1C
Hgb A1c MFr Bld: 5.4 % (ref 4.8–5.6)
Mean Plasma Glucose: 108.28 mg/dL

## 2019-02-07 MED ORDER — ENOXAPARIN SODIUM 40 MG/0.4ML ~~LOC~~ SOLN
40.0000 mg | SUBCUTANEOUS | Status: DC
  Administered 2019-02-07: 40 mg via SUBCUTANEOUS
  Filled 2019-02-07: qty 0.4

## 2019-02-07 MED ORDER — ACETAMINOPHEN 650 MG RE SUPP: 650.0000 mg | Freq: Four times a day (QID) | RECTAL | Status: DC | PRN

## 2019-02-07 MED ORDER — ACETAMINOPHEN 325 MG PO TABS
650.0000 mg | ORAL_TABLET | Freq: Four times a day (QID) | ORAL | Status: DC | PRN
  Administered 2019-02-08 – 2019-02-10 (×4): 650 mg via ORAL
  Filled 2019-02-07 (×4): qty 2

## 2019-02-07 MED ORDER — KCL IN DEXTROSE-NACL 20-5-0.45 MEQ/L-%-% IV SOLN
INTRAVENOUS | Status: DC
  Administered 2019-02-08 (×2): via INTRAVENOUS
  Filled 2019-02-07 (×2): qty 1000

## 2019-02-07 MED ORDER — ONDANSETRON HCL 4 MG/2ML IJ SOLN
4.0000 mg | Freq: Four times a day (QID) | INTRAMUSCULAR | Status: DC | PRN
  Filled 2019-02-07: qty 2

## 2019-02-07 MED ORDER — ONDANSETRON HCL 4 MG PO TABS
4.0000 mg | ORAL_TABLET | Freq: Four times a day (QID) | ORAL | Status: DC | PRN
  Administered 2019-02-09: 4 mg via ORAL
  Filled 2019-02-07: qty 1

## 2019-02-07 MED ORDER — METRONIDAZOLE IN NACL 5-0.79 MG/ML-% IV SOLN
500.0000 mg | Freq: Three times a day (TID) | INTRAVENOUS | Status: DC
  Administered 2019-02-07 – 2019-02-09 (×5): 500 mg via INTRAVENOUS
  Filled 2019-02-07 (×5): qty 100

## 2019-02-07 MED ORDER — SODIUM CHLORIDE 0.9 % IV SOLN
2.0000 g | INTRAVENOUS | Status: DC
  Administered 2019-02-07 – 2019-02-08 (×2): 2 g via INTRAVENOUS
  Filled 2019-02-07 (×3): qty 20

## 2019-02-07 MED ORDER — ONDANSETRON 4 MG PO TBDP
4.0000 mg | ORAL_TABLET | Freq: Once | ORAL | Status: AC
  Administered 2019-02-07: 18:00:00 4 mg via ORAL
  Filled 2019-02-07: qty 1

## 2019-02-07 MED ORDER — ONDANSETRON HCL 4 MG/2ML IJ SOLN
4.0000 mg | Freq: Once | INTRAMUSCULAR | Status: AC
  Administered 2019-02-07: 21:00:00 4 mg via INTRAVENOUS
  Filled 2019-02-07: qty 2

## 2019-02-07 MED ORDER — FENTANYL CITRATE (PF) 100 MCG/2ML IJ SOLN
100.0000 ug | Freq: Once | INTRAMUSCULAR | Status: AC
  Administered 2019-02-07: 21:00:00 100 ug via INTRAVENOUS
  Filled 2019-02-07: qty 2

## 2019-02-07 NOTE — ED Triage Notes (Signed)
Patient complains of intermittent epigastric pain with nausea and emesis x 2 years. States that it happens after fried foods. NAD on assessment, no vomiting

## 2019-02-07 NOTE — ED Provider Notes (Signed)
Spoke with general surgery about the patient and they will evaluate her.  I also spoke with family practice residents who will admit the patient.  I did speak with [facility] as well and they will see the patient in the morning.  Patient has been stable here in the emergency department and will be admitted.   Dalia Heading, PA-C 02/07/19 2351    Elnora Morrison, MD 02/08/19 [phone]

## 2019-02-07 NOTE — ED Notes (Signed)
Main lab processing labs at this time

## 2019-02-07 NOTE — ED Notes (Signed)
Main lab looking for light green tube

## 2019-02-07 NOTE — H&P (Signed)
Family Medicine Teaching [facility] Admission History and Physical Service Pager: [phone]  Patient name: Brandy Kaufman Medical record number: [id number] Date of birth: 1990-06-25 Age: 28 y.o. Gender: female  Primary Care Provider: Department, [facility] Consultants: GI, general surgery Code Status: Full  Chief Complaint: Nausea, vomiting, abdominal pain  Assessment and Plan: Brandy Kaufman is a 28 y.o. female presenting with 3-day history of nausea and vomiting with epigastric abdominal pain due to cholecystitis and choledocholithiasis.Marland Kitchen PMH is significant for no significant medical history  Cholecystitis and choledocholithiasis-patient has 3-day history of nausea, vomiting, epigastric pain.  Has had multiple episodes like this over the last 2 years, which have been increasing in frequency.  CBC and CMP were normal.  On exam patient has epigastric tenderness, was vomiting during exam.  Right upper quadrant ultrasound showed gallstones in the gallbladder neck with small amount of pericholecystic fluid and mildly thickened gallbladder wall.  Common bile duct was dilated to 9 mm.  Patient was given Zofran for nausea and fentanyl for pain.  Surgery and GI were consulted in the ED.  Patient will need to get ERCP first with GI, then cholecystectomy with general surgery.  Will start antibiotics in the meantime. -Admit to inpatient, Dr. Leveda Anna attending, MedSurg -General surgery consulted for cholecystectomy -GI consult for ERCP -Half normal saline with KCl 100 mL/h -Antibiotics: 2 g ceftriaxone daily, metronidazole 500 mg 3 times daily daily -CMP and CBC in a.m. -Vitals per routine -Intake and output -Up with assistance  FEN/GI: Half-normal saline, n.p.o., Prophylaxis: Lovenox  Disposition: MedSurg  History of Present Illness:  Brandy Kaufman is a 28 y.o. female presenting with 3-day history of nausea and vomiting with epigastric abdominal  pain.  Patient states it has been an ongoing issue for 2 years now.  She will get recurrent episodes of nausea, vomiting, epigastric pain that radiates bilaterally to the back that usually last 1 day to 2 days.  Usually happens after fatty meals.  Patient drinks alcohol maybe once a month.  Denies diarrhea.  These episodes have become more frequent in nature.  Patient states last night she had a few drinks of alcohol and also tried some [place] fries, but felt sick again this morning.  Patient is not take any medications.  She states she is not allergic to anything.  Review Of Systems: Per HPI with the following additions:   Review of Systems  Constitutional: Negative for fever.  Eyes: Negative for blurred vision.  Respiratory: Negative for cough and shortness of breath.   Cardiovascular: Negative for chest pain, palpitations and leg swelling.  Gastrointestinal: Positive for abdominal pain, nausea and vomiting. Negative for diarrhea.  Genitourinary: Negative for dysuria.  Neurological: Positive for dizziness. Negative for headaches.  Psychiatric/Behavioral: Negative for substance abuse.    Patient Active Problem List   Diagnosis Date Noted  . Status post tubal ligation 04/15/2017  . Indication for care in labor and delivery, antepartum 04/13/2017  . SVD (spontaneous vaginal delivery) 04/13/2017  . Adjustment disorder with mixed disturbance of emotions and conduct 04/18/2015  . MDD (major depressive disorder) 02/12/2015  . Postpartum depression 01/27/2014  . VBAC, delivered, current hospitalization 12/13/2013  . Abnormal Pap smear of cervix 06/08/2013  . Vaginal septum affecting pregnancy 06/08/2013  . Previous cesarean section 06/01/2013    Past Medical History: Past Medical History:  Diagnosis Date  . Depression     Past Surgical History: Past Surgical History:  Procedure Laterality Date  . CESAREAN SECTION    .  TUBAL LIGATION Bilateral 04/14/2017   Procedure: POST PARTUM  TUBAL LIGATION;  Surgeon: Lavonia Drafts, MD;  Location: [facility];  Service: Gynecology;  Laterality: Bilateral;    Social History: Social History   Tobacco Use  . Smoking status: Never Smoker  . Smokeless tobacco: Never Used  Substance Use Topics  . Alcohol use: Yes    Comment: 1 or 2/month  . Drug use: No   Additional social history:   Please also refer to relevant sections of EMR.  Family History: Family History  Problem Relation Age of Onset  . Diabetes Father   . Diabetes Paternal Grandmother   . Cancer Paternal Grandmother      Allergies and Medications: No Known Allergies No current facility-administered medications on file prior to encounter.    Current Outpatient Medications on File Prior to Encounter  Medication Sig Dispense Refill  . ibuprofen (ADVIL,MOTRIN) 600 MG tablet Take 1 tablet (600 mg total) by mouth every 6 (six) hours. 30 tablet 0    Objective: BP 109/69   Pulse (!) 55   Temp 98.6 F (37 C)   Resp 16   Ht 5\' 1"  (1.549 m)   Wt 79.4 kg   SpO2 99%   BMI 33.07 kg/m  Exam: General: Alert and oriented, mild to moderate pain.  Vomiting. Eyes: PERRLA.  EOMI.  No scleral icterus. ENTM: Moist oral mucosa.  No erythema. Neck: Soft, supple.  No thyromegaly Cardiovascular: Regular rate and rhythm.  No murmurs.  No pedal edema.  2+ radial pulse bilaterally Respiratory: L CTA B.  No wheezes.  No crackles Gastrointestinal: Soft, tender to palpation epigastrically.  Normal bowel sounds. MSK: Moves upper and lower extremities spontaneously.  Sitting up in exam table Derm: No rashes.  Skin warm and dry. Neuro: Cranial nerves II through XII grossly intact Psych: Normal affect.  Makes a good eye contact  Labs and Imaging: CBC BMET  Recent Labs  Lab 02/07/19 1637  WBC 6.8  HGB 12.4  HCT 38.9  PLT 231   Recent Labs  Lab 02/07/19 1957  NA 141  K 3.8  CL 107  CO2 24  BUN 8  CREATININE 0.64  GLUCOSE 93  CALCIUM 8.7*      Cholelithiasis and gallbladder polyps with sonographic evidence of acute cholecystitis. Extrahepatic biliary ductal dilatation with choledocholithiasis. Mild hepatic steatosis  Benay Pike, MD 02/07/2019, 9:10 PM PGY-2, [employer] pager: [phone], text pages welcome

## 2019-02-07 NOTE — Consult Note (Addendum)
Reason for Consult: choledocholithiasis Referring Physician: Ebbie Ridgehris Lawyer, MD  [facility] Terrilee Kaufman is an 28 y.o. female.   HPI: 23F with a two year history of intermittent RUQ abdominal pain that became worse in the last three days. Pain was associated with n/v as well. Emesis described as dark or yellow and non-bloody. +flatus and BM. Minimal appetite.   Past Medical History:  Diagnosis Date  . Depression     Past Surgical History:  Procedure Laterality Date  . CESAREAN SECTION    . TUBAL LIGATION Bilateral 04/14/2017   Procedure: POST PARTUM TUBAL LIGATION;  Surgeon: Brandy RosenthalHarraway-Smith, Carolyn, MD;  [facility];  Service: Gynecology;  Laterality: Bilateral;    Family History  Problem Relation Age of Onset  . Diabetes Father   . Diabetes Paternal Grandmother   . Cancer Paternal Grandmother     Social History:  reports that she has never smoked. She has never used smokeless tobacco. She reports current alcohol use. She reports that she does not use drugs.  Allergies: No Known Allergies  Medications: I have reviewed the patient's current medications.  Results for orders placed or performed during the hospital encounter of 02/07/19 (from the past 48 hour(s))  CBC     Status: None   Collection Time: 02/07/19  4:37 PM  Result Value Ref Range   WBC 6.8 4.0 - 10.5 K/uL   RBC 4.61 3.87 - 5.11 MIL/uL   Hemoglobin 12.4 12.0 - 15.0 g/dL   HCT 16.138.9 09.636.0 - 04.546.0 %   MCV 84.4 80.0 - 100.0 fL   MCH 26.9 26.0 - 34.0 pg   MCHC 31.9 30.0 - 36.0 g/dL   RDW 40.913.7 81.111.5 - 91.415.5 %   Platelets 231 150 - 400 K/uL   nRBC 0.0 0.0 - 0.2 %    Comment: Performed at [facility], [address] [place], [place] [address]  I-Stat beta hCG blood, ED     Status: None   Collection Time: 02/07/19  5:24 PM  Result Value Ref Range   I-stat hCG, quantitative <5.0 <5 mIU/mL   Comment 3            Comment:   GEST. AGE      CONC.  (mIU/mL)   <=1 WEEK        5 - 50     2  WEEKS       50 - 500     3 WEEKS       100 - 10,000     4 WEEKS     1,000 - 30,000        FEMALE AND NON-PREGNANT FEMALE:     LESS THAN 5 mIU/mL   Comprehensive metabolic panel     Status: Abnormal   Collection Time: 02/07/19  7:57 PM  Result Value Ref Range   Sodium 141 135 - 145 mmol/L   Potassium 3.8 3.5 - 5.1 mmol/L   Chloride 107 98 - 111 mmol/L   CO2 24 22 - 32 mmol/L   Glucose, Bld 93 70 - 99 mg/dL   BUN 8 6 - 20 mg/dL   Creatinine, Ser 6.210.64 0.44 - 1.00 mg/dL   Calcium 8.7 (L) 8.9 - 10.3 mg/dL   Total Protein 7.3 6.5 - 8.1 g/dL   Albumin 4.1 3.5 - 5.0 g/dL   AST 16 15 - 41 U/L   ALT 11 0 - 44 U/L   Alkaline Phosphatase 49 38 - 126 U/L  Total Bilirubin 0.5 0.3 - 1.2 mg/dL   GFR calc non Af Amer >60 >60 mL/min   GFR calc Af Amer >60 >60 mL/min   Anion gap 10 5 - 15    Comment: Performed at [facility] [address] [place], [place] [address]  Lipase, blood     Status: None   Collection Time: 02/07/19  7:57 PM  Result Value Ref Range   Lipase 25 11 - 51 U/L    Comment: Performed at [facility] [address], [place], [place] [address]  Urinalysis, Routine w reflex microscopic     Status: Abnormal   Collection Time: 02/07/19  8:05 PM  Result Value Ref Range   Color, Urine YELLOW YELLOW   APPearance CLEAR CLEAR   Specific Gravity, Urine >1.030 (H) 1.005 - 1.030   pH 5.5 5.0 - 8.0   Glucose, UA NEGATIVE NEGATIVE mg/dL   Hgb urine dipstick LARGE (A) NEGATIVE   Bilirubin Urine NEGATIVE NEGATIVE   Ketones, ur NEGATIVE NEGATIVE mg/dL   Protein, ur NEGATIVE NEGATIVE mg/dL   Nitrite NEGATIVE NEGATIVE   Leukocytes,Ua NEGATIVE NEGATIVE    Comment: Performed at [facility] [address] [place], [place] [address]  Urinalysis, Microscopic (reflex)     Status: None   Collection Time: 02/07/19  8:05 PM  Result Value Ref Range   RBC / HPF 11-20 0 - 5 RBC/hpf   WBC, UA 0-5 0 - 5 WBC/hpf   Bacteria, UA NONE SEEN NONE SEEN   Squamous Epithelial / LPF  0-5 0 - 5   Mucus PRESENT    Urine-Other LESS THAN 10 mL OF URINE SUBMITTED     Comment: MICROSCOPIC EXAM PERFORMED ON UNCONCENTRATED URINE Performed at [facility], [place] [address], [place], [place] [address]     US Abdomen Limited Ruq  Result Date: 02/07/2019 CLINICAL DATA:  Upper abdominal pain EXAM: ULTRASOUND ABDOMEN LIMITED RIGHT UPPER QUADRANT COMPARISON:  None. FINDINGS: Gallbladder: Multiple echogenic, shadowing gallstones are present within the gallbladder neck largest measuring up to 17 mm in maximal diameter. Additional nonshadowing stone or polyp is seen in the mid gallbladder measuring up to 4 mm. Small amount of pericholecystic fluid is present. Gallbladder wall is mildly thickened at 5 mm. Sonographic Brandy Kaufman sign is reportedly positive. Common bile duct: Diameter: 9 mm, dilated. There is an echogenic focus in the mid extrahepatic CBD measuring up to 6 mm concerning for an intraductal gallstones Liver: Diffusely increased hepatic echogenicity and diminished posterior through transmission compatible with hepatic steatosis. No focal lesion identified. Portal vein is patent on color Doppler imaging with normal direction of blood flow towards the liver. Other: None. IMPRESSION: Cholelithiasis and gallbladder polyps with sonographic evidence of acute cholecystitis. Extrahepatic biliary ductal dilatation with choledocholithiasis. Mild hepatic steatosis Electronically Signed   By: Brandy Kaufman M.D.   On: 02/07/2019 19:34    ROS Blood pressure 109/69, pulse (!) 55, temperature 98.6 F (37 C), resp. rate 16, height 5\' 1"  (1.549 m), weight 79.4 kg, SpO2 99 %, unknown if currently breastfeeding.   Physical Exam Gen: actively vomiting Neuro: non-focal exam HEENT: PERRL Neck: supple CV: RRR Pulm: unlabored breathing Abd: soft, NT Extr: wwp, no edema    Assessment/Plan: 47F with choledocholithiasis and cholecystitis.   Admit to IMS with plan for ERCP by GI. Abx per primary, but  would recommend zosyn. Lap chole after duct is cleared or concomitantly at the time of ERCP.  Montel Culver  Ethelreda Sukhu 02/07/2019, 9:06 PM

## 2019-02-07 NOTE — ED Provider Notes (Signed)
[facility] Provider Note   CSN: [id number] Arrival date & time: 02/07/19  1621     History   Chief Complaint Chief Complaint  Patient presents with  . Abdominal Pain    HPI Brandy Kaufman is a 28 y.o. female presenting for evaluation of abdominal pain.  Patient states for the past 2 years, she has been having intermittent abdominal pain.  It normally lasts for 2 to 3 hours, will resolve when she is able to make her self throw up.  Pain is worse after eating, specifically if she eats fatty foods.  For the past 3 days, she has been having persistent symptoms.  Patient states that is less painful, however she is having more nausea and persistent vomiting.  She has been unable to tolerate p.o. at home.  When she has pain, pain is epigastric and towards the right upper quadrant.  She has not had this evaluated before.  She has fevers, chills, chest pain, shortness of breath, lower abdominal pain, urinary symptoms, abnormal bowel movements.  She reports a history of a C-section and tubal ligation, no other abdominal surgeries.  No previous history of abdominal problems.  She has no medical problems, takes no medications daily.     HPI  Past Medical History:  Diagnosis Date  . Depression     Patient Active Problem List   Diagnosis Date Noted  . Status post tubal ligation 04/15/2017  . Indication for care in labor and delivery, antepartum 04/13/2017  . SVD (spontaneous vaginal delivery) 04/13/2017  . Adjustment disorder with mixed disturbance of emotions and conduct 04/18/2015  . MDD (major depressive disorder) 02/12/2015  . Postpartum depression 01/27/2014  . VBAC, delivered, current hospitalization 12/13/2013  . Abnormal Pap smear of cervix 06/08/2013  . Vaginal septum affecting pregnancy 06/08/2013  . Previous cesarean section 06/01/2013    Past Surgical History:  Procedure Laterality Date  . CESAREAN SECTION    . TUBAL  LIGATION Bilateral 04/14/2017   Procedure: POST PARTUM TUBAL LIGATION;  Surgeon: Willodean RosenthalHarraway-Smith, Carolyn, MD;  [facility];  Service: Gynecology;  Laterality: Bilateral;     OB History    Gravida  3   Para  3   Term  3   Preterm  0   AB  0   Living  3     SAB  0   TAB  0   Ectopic  0   Multiple  0   Live Births  3            Home Medications    Prior to Admission medications   Medication Sig Start Date End Date Taking? Authorizing Provider  ibuprofen (ADVIL,MOTRIN) 600 MG tablet Take 1 tablet (600 mg total) by mouth every 6 (six) hours. Patient not taking: Reported on 10/26/2017 04/15/17   Rolm BookbinderMoss, Amber, DO    Family History Family History  Problem Relation Age of Onset  . Diabetes Father   . Diabetes Paternal Grandmother   . Cancer Paternal Grandmother     Social History Social History   Tobacco Use  . Smoking status: Never Smoker  . Smokeless tobacco: Never Used  Substance Use Topics  . Alcohol use: Yes    Comment: 1 or 2/month  . Drug use: No     Allergies   Patient has no known allergies.   Review of Systems Review of Systems  Gastrointestinal: Positive for abdominal pain, nausea and vomiting.  All other systems reviewed and  are negative.    Physical Exam Updated Vital Signs BP 117/73 (BP Location: Left Arm)   Pulse 74   Temp 98.6 F (37 C)   Resp 14   Ht 5\' 1"  (1.549 m)   Wt 79.4 kg   SpO2 97%   BMI 33.07 kg/m   Physical Exam Vitals signs and nursing note reviewed.  Constitutional:      General: She is not in acute distress.    Appearance: She is well-developed.     Comments: Sitting comfortably in the chair no acute distress  HENT:     Head: Normocephalic and atraumatic.  Eyes:     Conjunctiva/sclera: Conjunctivae normal.     Pupils: Pupils are equal, round, and reactive to light.  Neck:     Musculoskeletal: Normal range of motion and neck supple.  Cardiovascular:     Rate and Rhythm: Normal rate  and regular rhythm.     Pulses: Normal pulses.  Pulmonary:     Effort: Pulmonary effort is normal. No respiratory distress.     Breath sounds: Normal breath sounds. No wheezing.  Abdominal:     General: There is no distension.     Palpations: Abdomen is soft. There is no mass.     Tenderness: There is abdominal tenderness in the right upper quadrant and epigastric area. There is no guarding or rebound. Positive signs include Murphy's sign.     Comments: Tenderness palpation of epigastric and right upper quadrant abdomen.  Positive Murphy's.  No tenderness palpation also in the abdomen.  No rigidity, guarding, distention.  Negative rebound.  No CVA tenderness.  Musculoskeletal: Normal range of motion.  Skin:    General: Skin is warm and dry.     Capillary Refill: Capillary refill takes less than 2 seconds.  Neurological:     Mental Status: She is alert and oriented to person, place, and time.      ED Treatments / Results  Labs (all labs ordered are listed, but only abnormal results are displayed) Labs Reviewed  LIPASE, BLOOD  COMPREHENSIVE METABOLIC PANEL  CBC  URINALYSIS, ROUTINE W REFLEX MICROSCOPIC  I-STAT BETA HCG BLOOD, ED ([facility], [facility], [facility] ONLY)    EKG None  Radiology No results found.  Procedures Procedures (including critical care time)  Medications Ordered in ED Medications  ondansetron (ZOFRAN-ODT) disintegrating tablet 4 mg (4 mg Oral Given 02/07/19 1815)     Initial Impression / Assessment and Plan / ED Course  I have reviewed the triage vital signs and the nursing notes.  Pertinent labs & imaging results that were available during my care of the patient were reviewed by me and considered in my medical decision making (see chart for details).        Patient presenting for evaluation of abdominal pain, nausea, vomiting.  Physical exam shows patient appears nontoxic.  She is tender epigastric and right upper quadrant abdomen.  Positive Murphy's.  Likely  gallstone/gallbladder etiology considering prolonged duration and intermittent pain.  However, also consider pancreatitis versus gastritis versus PUD.  Obtain labs and right upper quadrant ultrasound.  Zofran for nausea.  Pt signed out to 02/09/19, PA-C for follow-up on labs and ultrasound.  If there are signs of cholecystitis, patient may need general surgery evaluation.  If everything is normal, patient can likely be discharged if sxs are controlled.   Final Clinical Impressions(s) / ED Diagnoses   Final diagnoses:  Upper abdominal pain    ED Discharge Orders    None  Franchot Heidelberg, PA-C 02/07/19 1926    Blanchie Dessert, MD 02/08/19 [phone]

## 2019-02-07 NOTE — ED Notes (Signed)
Pt given urine cup to collect sample

## 2019-02-07 NOTE — ED Notes (Signed)
Patient transported to US 

## 2019-02-08 ENCOUNTER — Encounter (HOSPITAL_COMMUNITY)
Admission: EM | Disposition: A | Discharge status: Home / Self Care | Payer: Self-pay | Source: Home / Self Care | Attending: Family Medicine

## 2019-02-08 ENCOUNTER — Inpatient Hospital Stay (HOSPITAL_COMMUNITY): Payer: Medicaid Other | Admitting: Anesthesiology

## 2019-02-08 ENCOUNTER — Encounter (HOSPITAL_COMMUNITY): Payer: Self-pay | Admitting: Anesthesiology

## 2019-02-08 ENCOUNTER — Inpatient Hospital Stay (HOSPITAL_COMMUNITY): Payer: Medicaid Other

## 2019-02-08 DIAGNOSIS — R1110 Vomiting, unspecified: Secondary | ICD-10-CM | POA: Insufficient documentation

## 2019-02-08 DIAGNOSIS — R112 Nausea with vomiting, unspecified: Secondary | ICD-10-CM

## 2019-02-08 HISTORY — PX: REMOVAL OF STONES: SHX5545

## 2019-02-08 HISTORY — PX: SPHINCTEROTOMY: SHX5544

## 2019-02-08 HISTORY — PX: ERCP: SHX5425

## 2019-02-08 LAB — COMPREHENSIVE METABOLIC PANEL
ALT: 12 U/L (ref 0–44)
AST: 17 U/L (ref 15–41)
Albumin: 3.7 g/dL (ref 3.5–5.0)
Alkaline Phosphatase: 43 U/L (ref 38–126)
Anion gap: 8 (ref 5–15)
BUN: 9 mg/dL (ref 6–20)
CO2: 24 mmol/L (ref 22–32)
Calcium: 8.4 mg/dL — ABNORMAL LOW (ref 8.9–10.3)
Chloride: 108 mmol/L (ref 98–111)
Creatinine, Ser: 0.69 mg/dL (ref 0.44–1.00)
GFR calc Af Amer: >60 mL/min (ref >60)
GFR calc non Af Amer: >60 mL/min (ref >60)
Glucose, Bld: 113 mg/dL — ABNORMAL HIGH (ref 70–99)
Potassium: 3.7 mmol/L (ref 3.5–5.1)
Sodium: 140 mmol/L (ref 135–145)
Total Bilirubin: 0.3 mg/dL (ref 0.3–1.2)
Total Protein: 6.6 g/dL (ref 6.5–8.1)

## 2019-02-08 LAB — SURGICAL PCR SCREEN
MRSA, PCR: NEGATIVE
Staphylococcus aureus: POSITIVE — AB

## 2019-02-08 LAB — CBC
HCT: 36 % (ref 36.0–46.0)
Hemoglobin: 11.4 g/dL — ABNORMAL LOW (ref 12.0–15.0)
MCH: 26.8 pg (ref 26.0–34.0)
MCHC: 31.7 g/dL (ref 30.0–36.0)
MCV: 84.7 fL (ref 80.0–100.0)
Platelets: 216 10*3/uL (ref 150–400)
RBC: 4.25 MIL/uL (ref 3.87–5.11)
RDW: 13.7 % (ref 11.5–15.5)
WBC: 10.7 10*3/uL — ABNORMAL HIGH (ref 4.0–10.5)
nRBC: 0 % (ref 0.0–0.2)

## 2019-02-08 LAB — PROTIME-INR
INR: 1.1 (ref 0.8–1.2)
Prothrombin Time: 14.5 seconds (ref 11.4–15.2)

## 2019-02-08 LAB — HIV ANTIBODY (ROUTINE TESTING W REFLEX): HIV Screen 4th Generation wRfx: NONREACTIVE

## 2019-02-08 SURGERY — ERCP, WITH INTERVENTION IF INDICATED
Anesthesia: General

## 2019-02-08 MED ORDER — DEXTROSE-NACL 5-0.45 % IV SOLN: INTRAVENOUS | Status: DC

## 2019-02-08 MED ORDER — PROMETHAZINE HCL 25 MG/ML IJ SOLN
6.2500 mg | Freq: Once | INTRAMUSCULAR | Status: AC
  Administered 2019-02-08: 6.25 mg via INTRAVENOUS

## 2019-02-08 MED ORDER — PROMETHAZINE HCL 25 MG/ML IJ SOLN
INTRAMUSCULAR | Status: AC
  Filled 2019-02-08: qty 1

## 2019-02-08 MED ORDER — INDOMETHACIN 50 MG RE SUPP
RECTAL | Status: AC
  Filled 2019-02-08: qty 2

## 2019-02-08 MED ORDER — ONDANSETRON HCL 4 MG/2ML IJ SOLN
INTRAMUSCULAR | Status: DC | PRN
  Administered 2019-02-08: 4 mg via INTRAVENOUS

## 2019-02-08 MED ORDER — SUCCINYLCHOLINE CHLORIDE 200 MG/10ML IV SOSY
PREFILLED_SYRINGE | INTRAVENOUS | Status: DC | PRN
  Administered 2019-02-08: 100 mg via INTRAVENOUS

## 2019-02-08 MED ORDER — GLUCAGON HCL RDNA (DIAGNOSTIC) 1 MG IJ SOLR
INTRAMUSCULAR | Status: AC
  Filled 2019-02-08: qty 1

## 2019-02-08 MED ORDER — CIPROFLOXACIN IN D5W 400 MG/200ML IV SOLN
INTRAVENOUS | Status: AC
  Filled 2019-02-08: qty 200

## 2019-02-08 MED ORDER — FENTANYL CITRATE (PF) 100 MCG/2ML IJ SOLN
INTRAMUSCULAR | Status: AC
  Filled 2019-02-08: qty 2

## 2019-02-08 MED ORDER — SODIUM CHLORIDE 0.9 % IV SOLN
INTRAVENOUS | Status: DC
  Administered 2019-02-08: 10:00:00 via INTRAVENOUS

## 2019-02-08 MED ORDER — INDOMETHACIN 50 MG RE SUPP
RECTAL | Status: DC | PRN
  Administered 2019-02-08: 100 mg via RECTAL

## 2019-02-08 MED ORDER — MUPIROCIN 2 % EX OINT
1.0000 "application " | TOPICAL_OINTMENT | Freq: Two times a day (BID) | CUTANEOUS | Status: DC
  Administered 2019-02-08 – 2019-02-10 (×4): 1 via NASAL
  Filled 2019-02-08: qty 22

## 2019-02-08 MED ORDER — LACTATED RINGERS IV SOLN
INTRAVENOUS | Status: DC | PRN
  Administered 2019-02-08: 11:00:00 via INTRAVENOUS

## 2019-02-08 MED ORDER — LIDOCAINE 2% (20 MG/ML) 5 ML SYRINGE
INTRAMUSCULAR | Status: DC | PRN
  Administered 2019-02-08: 50 mg via INTRAVENOUS

## 2019-02-08 MED ORDER — DEXAMETHASONE SODIUM PHOSPHATE 10 MG/ML IJ SOLN
INTRAMUSCULAR | Status: DC | PRN
  Administered 2019-02-08: 10 mg via INTRAVENOUS

## 2019-02-08 MED ORDER — FENTANYL CITRATE (PF) 250 MCG/5ML IJ SOLN
INTRAMUSCULAR | Status: DC | PRN
  Administered 2019-02-08: 100 ug via INTRAVENOUS

## 2019-02-08 MED ORDER — PROPOFOL 10 MG/ML IV BOLUS
INTRAVENOUS | Status: DC | PRN
  Administered 2019-02-08: 140 mg via INTRAVENOUS

## 2019-02-08 MED ORDER — SODIUM CHLORIDE 0.9 % IV SOLN
INTRAVENOUS | Status: DC | PRN
  Administered 2019-02-08: 20 mL

## 2019-02-08 MED ORDER — PHENYLEPHRINE HCL (PRESSORS) 10 MG/ML IV SOLN
INTRAVENOUS | Status: DC | PRN
  Administered 2019-02-08 (×2): 80 ug via INTRAVENOUS

## 2019-02-08 NOTE — Transfer of Care (Signed)
Immediate Anesthesia Transfer of Care Note  Patient: Brandy Kaufman  Procedure(s) Performed: ENDOSCOPIC RETROGRADE CHOLANGIOPANCREATOGRAPHY (ERCP) (N/A ) SPHINCTEROTOMY  Patient Location: PACU and Endoscopy Unit  Anesthesia Type:General  Level of Consciousness: awake and patient cooperative  Airway & Oxygen Therapy: Patient Spontanous Breathing and Patient connected to nasal cannula oxygen  Post-op Assessment: Report given to RN and Post -op Vital signs reviewed and stable  Post vital signs: Reviewed and stable  Last Vitals:  Vitals Value Taken Time  BP    Temp    Pulse    Resp    SpO2      Last Pain:  Vitals:   02/08/19 1040  TempSrc: Temporal  PainSc: 0-No pain         Complications: No apparent anesthesia complications

## 2019-02-08 NOTE — Progress Notes (Signed)
[facility] Progress Note  Day of Surgery  Subjective: CC: RUQ pain Mild RUQ pain. Intermittent nausea. Actually feels somewhat hungry. Discussed possible OR tomorrow and patient in agreement.   Objective: Vital signs in last 24 hours: Temp:  [98.3 F (36.8 C)-98.6 F (37 C)] 98.3 F (36.8 C) (10/25 0731) Pulse Rate:  [51-74] 51 (10/25 0731) Resp:  [14-16] 16 (10/25 0731) BP: (95-117)/(59-74) 100/59 (10/25 0731) SpO2:  [97 %-99 %] 99 % (10/25 0731) Weight:  [79.4 kg] 79.4 kg (10/24 1628)    Intake/Output from previous day: 10/24 0701 - 10/25 0700 In: 100 [IV Piggyback:100] Out: -  Intake/Output this shift: Total I/O In: 100 [IV Piggyback:100] Out: -   PE: Gen:  Alert, NAD, pleasant Card:  Regular rate and rhythm, pedal pulses 2+ BL Pulm:  Normal effort, clear to auscultation bilaterally Abd: Soft, mildly tender RUQ, non-distended, +BS Skin: warm and dry, no rashes  Psych: A&Ox3   Lab Results:  Recent Labs    02/07/19 1637 02/08/19 0436  WBC 6.8 10.7*  HGB 12.4 11.4*  HCT 38.9 36.0  PLT 231 216   BMET Recent Labs    02/07/19 1957 02/08/19 0436  NA 141 140  K 3.8 3.7  CL 107 108  CO2 24 24  GLUCOSE 93 113*  BUN 8 9  CREATININE 0.64 0.69  CALCIUM 8.7* 8.4*   PT/INR Recent Labs    02/08/19 0436  LABPROT 14.5  INR 1.1   CMP     Component Value Date/Time   NA 140 02/08/2019 0436   K 3.7 02/08/2019 0436   CL 108 02/08/2019 0436   CO2 24 02/08/2019 0436   GLUCOSE 113 (H) 02/08/2019 0436   BUN 9 02/08/2019 0436   CREATININE 0.69 02/08/2019 0436   CALCIUM 8.4 (L) 02/08/2019 0436   PROT 6.6 02/08/2019 0436   ALBUMIN 3.7 02/08/2019 0436   AST 17 02/08/2019 0436   ALT 12 02/08/2019 0436   ALKPHOS 43 02/08/2019 0436   BILITOT 0.3 02/08/2019 0436   GFRNONAA >60 02/08/2019 0436   GFRAA >60 02/08/2019 0436   Lipase     Component Value Date/Time   LIPASE 25 02/07/2019 1957       Studies/Results: US Abdomen Limited  Ruq  Result Date: 02/07/2019 CLINICAL DATA:  Upper abdominal pain EXAM: ULTRASOUND ABDOMEN LIMITED RIGHT UPPER QUADRANT COMPARISON:  None. FINDINGS: Gallbladder: Multiple echogenic, shadowing gallstones are present within the gallbladder neck largest measuring up to 17 mm in maximal diameter. Additional nonshadowing stone or polyp is seen in the mid gallbladder measuring up to 4 mm. Small amount of pericholecystic fluid is present. Gallbladder wall is mildly thickened at 5 mm. Sonographic Eulah Pont sign is reportedly positive. Common bile duct: Diameter: 9 mm, dilated. There is an echogenic focus in the mid extrahepatic CBD measuring up to 6 mm concerning for an intraductal gallstones Liver: Diffusely increased hepatic echogenicity and diminished posterior through transmission compatible with hepatic steatosis. No focal lesion identified. Portal vein is patent on color Doppler imaging with normal direction of blood flow towards the liver. Other: None. IMPRESSION: Cholelithiasis and gallbladder polyps with sonographic evidence of acute cholecystitis. Extrahepatic biliary ductal dilatation with choledocholithiasis. Mild hepatic steatosis Electronically Signed   By: Kreg Shropshire M.D.   On: 02/07/2019 19:34    Anti-infectives: Anti-infectives (From admission, onward)   Start     Dose/Rate Route Frequency Ordered Stop   02/07/19 2215  cefTRIAXone (ROCEPHIN) 2 g in sodium chloride 0.9 % 100 mL  IVPB     2 g 200 mL/hr over 30 Minutes Intravenous Every 24 hours 02/07/19 2205     02/07/19 2215  metroNIDAZOLE (FLAGYL) IVPB 500 mg     500 mg 100 mL/hr over 60 Minutes Intravenous Every 8 hours 02/07/19 2205         Assessment/Plan Choledocholithiasis and cholecystitis - ERCP today - will prep for lap chole tomorrow - repeat labs in AM  FEN: NPO, IVF VTE: SCDs ID: rocephin/flagyl 10/24>>  LOS: 1 day    Brigid Re , [facility] 02/08/2019, 10:22 AM Please see Amion for pager  number during day hours 7:00am-4:30pm

## 2019-02-08 NOTE — ED Notes (Signed)
No sign and held orders listed at this time

## 2019-02-08 NOTE — Anesthesia Preprocedure Evaluation (Signed)
Anesthesia Evaluation  Patient identified by MRN, date of birth, ID band Patient awake    Reviewed: Allergy & Precautions, NPO status , Patient's Chart, lab work & pertinent test results  History of Anesthesia Complications Negative for: history of anesthetic complications  Airway Mallampati: II  TM Distance: >3 FB Neck ROM: Full    Dental  (+) Dental Advisory Given, Teeth Intact   Pulmonary neg pulmonary ROS, neg recent URI,    breath sounds clear to auscultation       Cardiovascular negative cardio ROS   Rhythm:Regular     Neuro/Psych PSYCHIATRIC DISORDERS Depression negative neurological ROS     GI/Hepatic Neg liver ROS, bile duct stone   Endo/Other  Morbid obesity  Renal/GU negative Renal ROS     Musculoskeletal   Abdominal   Peds  Hematology negative hematology ROS (+)   Anesthesia Other Findings   Reproductive/Obstetrics                             Anesthesia Physical Anesthesia Plan  ASA: II  Anesthesia Plan: General   Post-op Pain Management:    Induction: Intravenous  PONV Risk Score and Plan: 3 and Ondansetron and Dexamethasone  Airway Management Planned: Oral ETT  Additional Equipment: None  Intra-op Plan:   Post-operative Plan: Extubation in OR  Informed Consent: I have reviewed the patients History and Physical, chart, labs and discussed the procedure including the risks, benefits and alternatives for the proposed anesthesia with the patient or authorized representative who has indicated his/her understanding and acceptance.     Dental advisory given  Plan Discussed with: CRNA and Surgeon  Anesthesia Plan Comments:         Anesthesia Quick Evaluation

## 2019-02-08 NOTE — Anesthesia Postprocedure Evaluation (Signed)
Anesthesia Post Note  Patient: Lilyana Lippman Hernandez-Vazquez  Procedure(s) Performed: ENDOSCOPIC RETROGRADE CHOLANGIOPANCREATOGRAPHY (ERCP) (N/A ) SPHINCTEROTOMY REMOVAL OF STONES     Patient location during evaluation: PACU Anesthesia Type: General Level of consciousness: awake and alert Pain management: pain level controlled Vital Signs Assessment: post-procedure vital signs reviewed and stable Respiratory status: spontaneous breathing, nonlabored ventilation, respiratory function stable and patient connected to nasal cannula oxygen Cardiovascular status: blood pressure returned to baseline and stable Postop Assessment: no apparent nausea or vomiting Anesthetic complications: no    Last Vitals:  Vitals:   02/08/19 1318 02/08/19 1421  BP: 116/80   Pulse: (!) 49 68  Resp: 15   Temp: 36.4 C   SpO2: 100%     Last Pain:  Vitals:   02/08/19 1729  TempSrc:   PainSc: 5                  Arnisha Laffoon

## 2019-02-08 NOTE — ED Notes (Signed)
Pt transported to Endo via w/c by Endo RN. Pt took all of her belongings.

## 2019-02-08 NOTE — Anesthesia Procedure Notes (Signed)
Procedure Name: Intubation Date/Time: 02/08/2019 11:31 AM Performed by: Renato Shin, CRNA Pre-anesthesia Checklist: Patient identified, Emergency Drugs available, Suction available and Patient being monitored Patient Re-evaluated:Patient Re-evaluated prior to induction Oxygen Delivery Method: Circle system utilized Preoxygenation: Pre-oxygenation with 100% oxygen Induction Type: IV induction Ventilation: Mask ventilation without difficulty Laryngoscope Size: Miller and 2 Grade View: Grade I Tube type: Oral Tube size: 7.0 mm Number of attempts: 1 Airway Equipment and Method: Stylet and Oral airway Placement Confirmation: ETT inserted through vocal cords under direct vision,  positive ETCO2 and breath sounds checked- equal and bilateral Secured at: 21 cm Tube secured with: Tape Dental Injury: Teeth and Oropharynx as per pre-operative assessment

## 2019-02-08 NOTE — Interval H&P Note (Signed)
History and Physical Interval Note:  02/08/2019 11:33 AM  Brandy Kaufman  has presented today for surgery, with the diagnosis of bile duct stone.  The various methods of treatment have been discussed with the patient and family. After consideration of risks, benefits and other options for treatment, the patient has consented to  Procedure(s): ENDOSCOPIC RETROGRADE CHOLANGIOPANCREATOGRAPHY (ERCP) (N/A) as a surgical intervention.  The patient's history has been reviewed, patient examined, no change in status, stable for surgery.  I have reviewed the patient's chart and labs.  Questions were answered to the patient's satisfaction.     Milus Banister

## 2019-02-08 NOTE — H&P (View-Only) (Signed)
[facility] Referring Provider: ER Doctor at [facility] Primary Care Physician:  Department, [facility] Primary Gastroenterologist:  None Reason for Consultation: Bile duct stone  HPI:  Brandy Kaufman is a 28 y.o. female who has had intermittent epigasric pains lasting 1-2 hours for at least a year.  + associated with nausea.  + colicy nature.  Sometimes after eating but not always.    She presented to ER yesterday with a vomiting illness and some mild abd pains.  Workup in ER showed normal LFTs, normal CBD.  US showed gallstones in her GB, thick GB wall, + fluid around her GB as well as a 9mm CBD containing a stone.  NO fevers or chills.  She is comfortable in ER currently.  WAs started on IV antibiotics  Past Medical History:  Diagnosis Date  . Depression     Past Surgical History:  Procedure Laterality Date  . CESAREAN SECTION    . TUBAL LIGATION Bilateral 04/14/2017   Procedure: POST PARTUM TUBAL LIGATION;  Surgeon: Harraway-Smith, Carolyn, MD;  Location: [facility];  Service: Gynecology;  Laterality: Bilateral;    Prior to Admission medications   Not on File    Current Facility-Administered Medications  Medication Dose Route Frequency Provider Last Rate Last Dose  . acetaminophen (TYLENOL) tablet 650 mg  650 mg Oral Q6H PRN Olson, Nickoli Bagheri K, MD       Or  . acetaminophen (TYLENOL) suppository 650 mg  650 mg Rectal Q6H PRN Olson, Anda Sobotta K, MD      . cefTRIAXone (ROCEPHIN) 2 g in sodium chloride 0.9 % 100 mL IVPB  2 g Intravenous Q24H Olson, Keliyah Spillman K, MD   Stopped at 02/07/19 2316  . dextrose 5 % and 0.45 % NaCl with KCl 20 mEq/L infusion   Intravenous Continuous Olson, Mayco Walrond K, MD 100 mL/hr at 02/08/19 0113    . metroNIDAZOLE (FLAGYL) IVPB 500 mg  500 mg Intravenous Q8H Olson, Shunna Mikaelian K, MD 100 mL/hr at 02/08/19 0644 500 mg at 02/08/19 0644  . ondansetron (ZOFRAN) tablet 4 mg  4 mg Oral Q6H PRN Olson, Vaishali Baise K, MD       Or  . ondansetron  (ZOFRAN) injection 4 mg  4 mg Intravenous Q6H PRN Olson, Shaneal Barasch K, MD       No current outpatient medications on file.    Allergies as of 02/07/2019  . (No Known Allergies)    Family History  Problem Relation Age of Onset  . Diabetes Father   . Diabetes Paternal Grandmother   . Cancer Paternal Grandmother     Social History   Socioeconomic History  . Marital status: Single    Spouse name: Not on file  . Number of children: Not on file  . Years of education: Not on file  . Highest education level: Not on file  Occupational History  . Not on file  Social Needs  . Financial resource strain: Not on file  . Food insecurity    Worry: Not on file    Inability: Not on file  . Transportation needs    Medical: Not on file    Non-medical: Not on file  Tobacco Use  . Smoking status: Never Smoker  . Smokeless tobacco: Never Used  Substance and Sexual Activity  . Alcohol use: Yes    Comment: 1 or 2/month  . Drug use: No  . Sexual activity: Yes    Birth control/protection: None  Lifestyle  . Physical activity      Days per week: Not on file    Minutes per session: Not on file  . Stress: Not on file  Relationships  . Social Herbalist on phone: Not on file    Gets together: Not on file    Attends religious service: Not on file    Active member of club or organization: Not on file    Attends meetings of clubs or organizations: Not on file    Relationship status: Not on file  . Intimate partner violence    Fear of current or ex partner: Not on file    Emotionally abused: Not on file    Physically abused: Not on file    Forced sexual activity: Not on file  Other Topics Concern  . Not on file  Social History Narrative   ** Merged History Encounter **         Review of Systems: Pertinent positive and negative review of systems were noted in the above HPI section. Complete review of systems was performed and was otherwise normal.   Physical Exam: Vital  signs in last 24 hours: Temp:  [98.3 F (36.8 C)-98.6 F (37 C)] 98.3 F (36.8 C) (10/25 0731) Pulse Rate:  [51-74] 51 (10/25 0731) Resp:  [14-16] 16 (10/25 0731) BP: (95-117)/(59-74) 100/59 (10/25 0731) SpO2:  [97 %-99 %] 99 % (10/25 0731) Weight:  [79.4 kg] 79.4 kg (10/24 1628)   Constitutional: generally well-appearing Psychiatric: alert and oriented x3 Eyes: extraocular movements intact Mouth: oral pharynx moist, no lesions Neck: supple no lymphadenopathy Cardiovascular: heart regular rate and rhythm Lungs: clear to auscultation bilaterally Abdomen: soft, nontender, nondistended, no obvious ascites, no peritoneal signs, normal bowel sounds Extremities: no lower extremity edema bilaterally Skin: no lesions on visible extremities   Lab Results: Recent Labs    02/07/19 1637 02/08/19 0436  WBC 6.8 10.7*  HGB 12.4 11.4*  HCT 38.9 36.0  PLT 231 216  MCV 84.4 84.7   BMET Recent Labs    02/07/19 1957 02/08/19 0436  NA 141 140  K 3.8 3.7  CL 107 108  CO2 24 24  GLUCOSE 93 113*  BUN 8 9  CREATININE 0.64 0.69  CALCIUM 8.7* 8.4*   LFT Recent Labs    02/08/19 0436  BILITOT 0.3  AST 17  ALT 12  ALKPHOS 43  PROT 6.6  ALBUMIN 3.7   PT/INR Recent Labs    02/08/19 0436  LABPROT 14.5  INR 1.1    Imaging/Other Results: US Abdomen Limited Ruq  Result Date: 02/07/2019 CLINICAL DATA:  Upper abdominal pain EXAM: ULTRASOUND ABDOMEN LIMITED RIGHT UPPER QUADRANT COMPARISON:  None. FINDINGS: Gallbladder: Multiple echogenic, shadowing gallstones are present within the gallbladder neck largest measuring up to 17 mm in maximal diameter. Additional nonshadowing stone or polyp is seen in the mid gallbladder measuring up to 4 mm. Small amount of pericholecystic fluid is present. Gallbladder wall is mildly thickened at 5 mm. Sonographic Percell Miller sign is reportedly positive. Common bile duct: Diameter: 9 mm, dilated. There is an echogenic focus in the mid extrahepatic CBD  measuring up to 6 mm concerning for an intraductal gallstones Liver: Diffusely increased hepatic echogenicity and diminished posterior through transmission compatible with hepatic steatosis. No focal lesion identified. Portal vein is patent on color Doppler imaging with normal direction of blood flow towards the liver. Other: None. IMPRESSION: Cholelithiasis and gallbladder polyps with sonographic evidence of acute cholecystitis. Extrahepatic biliary ductal dilatation with choledocholithiasis. Mild hepatic steatosis Electronically Signed  By: Kreg Shropshire M.D.   On: 02/07/2019 19:34    Impression/Plan: 28 y.o. female with symptomatic gallstone disease  Likely cholecystitis and clearly a CBD stone in slightly dilated bile duct on [place].  I recommended ERCP to clear her bile duct followed by cholecystectomy (timing per general surgery).  WE discussed ERCP risks, benefits (including pancreastitis, perforation, bleeding) and she understands and agrees to proceed.   Rachael Fee, MD  02/08/2019, 7:52 AM [facility] Pager [phone]

## 2019-02-08 NOTE — Plan of Care (Signed)

## 2019-02-08 NOTE — Progress Notes (Signed)
Family Medicine Teaching Service Daily Progress Note Intern Pager: [phone]  Patient name: Brandy Kaufman Medical record number: [id number] Date of birth: 1991-02-28 Age: 28 y.o. Gender: female  Primary Care Provider: Department, [facility] Consultants: GI and gen surg Code Status: full  Pt Overview and Major Events to Date:  10/24 - admitted for cholecystectomy and ERCP  Assessment and Plan: Brandy Kaufman is a 28 y.o. female presenting with 3-day history of nausea and vomiting with epigastric abdominal pain due to cholecystitis and choledocholithiasis.Marland Kitchen PMH is significant for no significant medical history  Cholecystitis and choledocholithiasis- pain and nausea improved this AM. Wbc increased from 6 to 10 overnight. Afebrile.  Awaiting GI to evaluate. Will likely get ERCP and then cholecystectomy.  - f/u gen surg recs: cholecystectomy after GI eval and possible ERCP - f/u GI recs - 1/2NSD5 KCl 182ml/hr - abx: CTX (10/24-) and flagyl (10/24-) - npo - zofran prn - tylenol prn - I/O - up with assistance - vitals per routine  Microscopic hematuria - UA showed 11-20 rbc/hpf. Urine pregnancy test negative.  - repeat UA on outpatient followup  FEN/GI: Half-normal saline, n.p.o., Prophylaxis: Lovenox  Disposition: home  Subjective:  Pt feeling less nauseous and less abdominal pain this morning.    Objective: Temp:  [98.6 F (37 C)] 98.6 F (37 C) (10/24 1627) Pulse Rate:  [51-74] 55 (10/24 2100) Resp:  [14-16] 16 (10/24 2100) BP: (95-117)/(64-74) 95/64 (10/25 0400) SpO2:  [97 %-99 %] 99 % (10/25 0400) Weight:  [79.4 kg] 79.4 kg (10/24 1628) Physical Exam: General: sleeping initially.  Easily awoken. No distress.  Cardiovascular: RRR. No murmur.  Respiratory: LCTAB. No wheezes.  Abdomen: soft, mildly tender epigastrically. Normal bowel sounds  Laboratory: Recent Labs  Lab 02/07/19 1637 02/08/19 0436  WBC 6.8 10.7*  HGB 12.4 11.4*   HCT 38.9 36.0  PLT 231 216   Recent Labs  Lab 02/07/19 1957 02/08/19 0436  NA 141 140  K 3.8 3.7  CL 107 108  CO2 24 24  BUN 8 9  CREATININE 0.64 0.69  CALCIUM 8.7* 8.4*  PROT 7.3 6.6  BILITOT 0.5 0.3  ALKPHOS 49 43  ALT 11 12  AST 16 17  GLUCOSE 93 113*    Benay Pike, MD 02/08/2019, 7:20 AM PGY-2, [place] Intern pager: [phone], text pages welcome

## 2019-02-08 NOTE — Op Note (Signed)
[facility] Patient Name: Brandy Kaufman Procedure Date : 02/08/2019 MRN: [id number] Attending MD: Rachael Fee , MD Date of Birth: 01-29-1991 CSN: [id number] Age: 28 Admit Type: Inpatient Procedure:                ERCP Indications:              abdmited with vomiting, abd pains, US showed 9mm                            bile containing a 6mm stone, cholcystitis and                            stones in the GB Providers:                Rachael Fee, MD, Margaree Mackintosh, RN,                            Lawson Radar, Technician, Leona Singleton, CRNA Referring MD:              Medicines:                General Anesthesia, Indomethacin 100 mg PR, IV                            antibiotics in ER Complications:            No immediate complications. Estimated blood loss:                            None Estimated Blood Loss:     Estimated blood loss: none. Procedure:                Pre-Anesthesia Assessment:                           - Prior to the procedure, a History and Physical                            was performed, and patient medications and                            allergies were reviewed. The patient's tolerance of                            previous anesthesia was also reviewed. The risks                            and benefits of the procedure and the sedation                            options and risks were discussed with the patient.                            All questions were answered, and informed consent  was obtained. Prior Anticoagulants: The patient has                            taken no previous anticoagulant or antiplatelet                            agents. ASA Grade Assessment: I - A normal, healthy                            patient. After reviewing the risks and benefits,                            the patient was deemed in satisfactory condition to                            undergo the procedure.                          After obtaining informed consent, the scope was                            passed under direct vision. Throughout the                            procedure, the patient's blood pressure, pulse, and                            oxygen saturations were monitored continuously. The                            TJF-Q180V ([id number]) Olympus duodenoscope was                            introduced through the mouth, and used to inject                            contrast into and used to inject contrast into the                            bile duct. The ERCP was accomplished without                            difficulty. The patient tolerated the procedure                            well. Scope In: Scope Out: Findings:      The duodenoscope was advanced to the region of the major papilla without       detailed examination of the UGI tract. The major papilla was normal       appearing. A 44 Autotome over a .035 hydrawire was used to cannulate the       bile duct. An identical wire was temporarily placed in the main       pancreatic duct to facilitate biliary cannulation. Cholangiogram       revealed  non-dilated extrahepatic duct, patent cystic duct, and obvious       stones within the gallbladder. There was a meniscus sign in the very       distal CBD which I felt might be the 6mm stone reported on US and so I       performed an adequate biliary sphincterotomy over the wire and then       swept the bile duct with a 9-2912mm biliary retrieval balloon (inflated to       9mm). No stone, stone fragment, sludge or purulence was delivered into       the duodenum. Occlusion cholangiogram showed no obvious stones and the       duodenoscope was removed from the patient. The main pancreatic duct was       briefly cannulated with a wire but never injected with dye. Impression:               - Non-dilated extrahepatic biliary tree. Patent                            cystic duct. Cholelithiasis.                            - Bilairy sphincterotomy performed for distal CBD                            meniscus sign (and US report that described a 6mm                            CBD stone) however no stones were found on balloon                            sweeping. I suspect the US reported CBD stone                            passed into her duodenum before this procedure or                            it was a false +. Recommendation:           - Return patient to hospital ward for ongoing care.                           - Cholecystectomy, timing per general surgery                            service. Procedure Code(s):        --- Professional ---                           [id number], Endoscopic retrograde                            cholangiopancreatography (ERCP); with removal of                            calculi/debris from biliary/pancreatic duct(s)  43262, Endoscopic retrograde                            cholangiopancreatography (ERCP); with                            sphincterotomy/papillotomy Diagnosis Code(s):        --- Professional ---                           K80.50, Calculus of bile duct without cholangitis                            or cholecystitis without obstruction CPT copyright 2019 American Medical Association. All rights reserved. The codes documented in this report are preliminary and upon coder review may  be revised to meet current compliance requirements. Milus Banister, MD 02/08/2019 12:27:11 PM This report has been signed electronically. Number of Addenda: 0

## 2019-02-08 NOTE — Consult Note (Addendum)
[facility] Primary [facility] Physician:  [facility] Primary Gastroenterologist:  None Reason for Consultation: Bile duct stone  HPI:  Brandy Kaufman is a 28 y.o. female who has had intermittent epigasric pains lasting 1-2 hours for at least a year.  + associated with nausea.  + colicy nature.  Sometimes after eating but not always.    She presented to ER yesterday with a vomiting illness and some mild abd pains.  Workup in ER showed normal LFTs, normal CBD.  US showed gallstones in her GB, thick GB wall, + fluid around her GB as well as a 9mm CBD containing a stone.  NO fevers or chills.  She is comfortable in ER currently.  WAs started on IV antibiotics  Past Medical History:  Diagnosis Date  . Depression     Past Surgical History:  Procedure Laterality Date  . CESAREAN SECTION    . TUBAL LIGATION Bilateral 04/14/2017   Procedure: POST PARTUM TUBAL LIGATION;  Surgeon: Willodean RosenthalHarraway-Smith, Carolyn, MD;  [facility];  Service: Gynecology;  Laterality: Bilateral;    Prior to Admission medications   Not on File    Current Facility-Administered Medications  Medication Dose Route Frequency Provider Last Rate Last Dose  . acetaminophen (TYLENOL) tablet 650 mg  650 mg Oral Q6H PRN Sandre Kittylson, Daniel K, MD       Or  . acetaminophen (TYLENOL) suppository 650 mg  650 mg Rectal Q6H PRN Sandre Kittylson, Daniel K, MD      . cefTRIAXone (ROCEPHIN) 2 g in sodium chloride 0.9 % 100 mL IVPB  2 g Intravenous Q24H Sandre Kittylson, Daniel K, MD   Stopped at 02/07/19 2316  . dextrose 5 % and 0.45 % NaCl with KCl 20 mEq/L infusion   Intravenous Continuous Sandre Kittylson, Daniel K, MD 100 mL/hr at 02/08/19 0113    . metroNIDAZOLE (FLAGYL) IVPB 500 mg  500 mg Intravenous Q8H Sandre Kittylson, Daniel K, MD 100 mL/hr at 02/08/19 0644 500 mg at 02/08/19 0644  . ondansetron (ZOFRAN) tablet 4 mg  4 mg Oral Q6H PRN Sandre Kittylson, Daniel K, MD       Or  . ondansetron  [facility]ZOFRAN) injection 4 mg  4 mg Intravenous Q6H PRN Sandre Kittylson, Daniel K, MD       No current outpatient medications on file.    Allergies as of 02/07/2019  . (No Known Allergies)    Family History  Problem Relation Age of Onset  . Diabetes Father   . Diabetes Paternal Grandmother   . Cancer Paternal Grandmother     Social History   Socioeconomic History  . Marital status: Single    Spouse name: Not on file  . Number of children: Not on file  . Years of education: Not on file  . Highest education level: Not on file  Occupational History  . Not on file  Social Needs  . Financial resource strain: Not on file  . Food insecurity    Worry: Not on file    Inability: Not on file  . Transportation needs    Medical: Not on file    Non-medical: Not on file  Tobacco Use  . Smoking status: Never Smoker  . Smokeless tobacco: Never Used  Substance and Sexual Activity  . Alcohol use: Yes    Comment: 1 or 2/month  . Drug use: No  . Sexual activity: Yes    Birth control/protection: None  Lifestyle  . Physical activity  Days per week: Not on file    Minutes per session: Not on file  . Stress: Not on file  Relationships  . Social Herbalist on phone: Not on file    Gets together: Not on file    Attends religious service: Not on file    Active member of club or organization: Not on file    Attends meetings of clubs or organizations: Not on file    Relationship status: Not on file  . Intimate partner violence    Fear of current or ex partner: Not on file    Emotionally abused: Not on file    Physically abused: Not on file    Forced sexual activity: Not on file  Other Topics Concern  . Not on file  Social History Narrative   ** Merged History Encounter **         Review of Systems: Pertinent positive and negative review of systems were noted in the above HPI section. Complete review of systems was performed and was otherwise normal.   Physical Exam: Vital  signs in last 24 hours: Temp:  [98.3 F (36.8 C)-98.6 F (37 C)] 98.3 F (36.8 C) (10/25 0731) Pulse Rate:  [51-74] 51 (10/25 0731) Resp:  [14-16] 16 (10/25 0731) BP: (95-117)/(59-74) 100/59 (10/25 0731) SpO2:  [97 %-99 %] 99 % (10/25 0731) Weight:  [79.4 kg] 79.4 kg (10/24 1628)   Constitutional: generally well-appearing Psychiatric: alert and oriented x3 Eyes: extraocular movements intact Mouth: oral pharynx moist, no lesions Neck: supple no lymphadenopathy Cardiovascular: heart regular rate and rhythm Lungs: clear to auscultation bilaterally Abdomen: soft, nontender, nondistended, no obvious ascites, no peritoneal signs, normal bowel sounds Extremities: no lower extremity edema bilaterally Skin: no lesions on visible extremities   Lab Results: Recent Labs    02/07/19 1637 02/08/19 0436  WBC 6.8 10.7*  HGB 12.4 11.4*  HCT 38.9 36.0  PLT 231 216  MCV 84.4 84.7   BMET Recent Labs    02/07/19 1957 02/08/19 0436  NA 141 140  K 3.8 3.7  CL 107 108  CO2 24 24  GLUCOSE 93 113*  BUN 8 9  CREATININE 0.64 0.69  CALCIUM 8.7* 8.4*   LFT Recent Labs    02/08/19 0436  BILITOT 0.3  AST 17  ALT 12  ALKPHOS 43  PROT 6.6  ALBUMIN 3.7   PT/INR Recent Labs    02/08/19 0436  LABPROT 14.5  INR 1.1    Imaging/Other Results: US Abdomen Limited Ruq  Result Date: 02/07/2019 CLINICAL DATA:  Upper abdominal pain EXAM: ULTRASOUND ABDOMEN LIMITED RIGHT UPPER QUADRANT COMPARISON:  None. FINDINGS: Gallbladder: Multiple echogenic, shadowing gallstones are present within the gallbladder neck largest measuring up to 17 mm in maximal diameter. Additional nonshadowing stone or polyp is seen in the mid gallbladder measuring up to 4 mm. Small amount of pericholecystic fluid is present. Gallbladder wall is mildly thickened at 5 mm. Sonographic Percell Miller sign is reportedly positive. Common bile duct: Diameter: 9 mm, dilated. There is an echogenic focus in the mid extrahepatic CBD  measuring up to 6 mm concerning for an intraductal gallstones Liver: Diffusely increased hepatic echogenicity and diminished posterior through transmission compatible with hepatic steatosis. No focal lesion identified. Portal vein is patent on color Doppler imaging with normal direction of blood flow towards the liver. Other: None. IMPRESSION: Cholelithiasis and gallbladder polyps with sonographic evidence of acute cholecystitis. Extrahepatic biliary ductal dilatation with choledocholithiasis. Mild hepatic steatosis Electronically Signed  By: Kreg Shropshire M.D.   On: 02/07/2019 19:34    Impression/Plan: 28 y.o. female with symptomatic gallstone disease  Likely cholecystitis and clearly a CBD stone in slightly dilated bile duct on [place].  I recommended ERCP to clear her bile duct followed by cholecystectomy (timing per general surgery).  WE discussed ERCP risks, benefits (including pancreastitis, perforation, bleeding) and she understands and agrees to proceed.   Rachael Fee, MD  02/08/2019, 7:52 AM [facility] Pager [phone]

## 2019-02-09 ENCOUNTER — Encounter (HOSPITAL_COMMUNITY)
Admission: EM | Disposition: A | Discharge status: Home / Self Care | Payer: Self-pay | Source: Home / Self Care | Attending: Family Medicine

## 2019-02-09 ENCOUNTER — Inpatient Hospital Stay (HOSPITAL_COMMUNITY): Payer: Medicaid Other | Admitting: Certified Registered"

## 2019-02-09 ENCOUNTER — Encounter (HOSPITAL_COMMUNITY): Payer: Self-pay

## 2019-02-09 DIAGNOSIS — R1010 Upper abdominal pain, unspecified: Secondary | ICD-10-CM

## 2019-02-09 DIAGNOSIS — R748 Abnormal levels of other serum enzymes: Secondary | ICD-10-CM

## 2019-02-09 DIAGNOSIS — K819 Cholecystitis, unspecified: Secondary | ICD-10-CM

## 2019-02-09 DIAGNOSIS — K810 Acute cholecystitis: Secondary | ICD-10-CM

## 2019-02-09 DIAGNOSIS — Z9889 Other specified postprocedural states: Secondary | ICD-10-CM

## 2019-02-09 HISTORY — PX: CHOLECYSTECTOMY: SHX55

## 2019-02-09 LAB — COMPREHENSIVE METABOLIC PANEL
ALT: 13 U/L (ref 0–44)
AST: 14 U/L — ABNORMAL LOW (ref 15–41)
Albumin: 3.3 g/dL — ABNORMAL LOW (ref 3.5–5.0)
Alkaline Phosphatase: 44 U/L (ref 38–126)
Anion gap: 8 (ref 5–15)
BUN: 11 mg/dL (ref 6–20)
CO2: 21 mmol/L — ABNORMAL LOW (ref 22–32)
Calcium: 8.2 mg/dL — ABNORMAL LOW (ref 8.9–10.3)
Chloride: 108 mmol/L (ref 98–111)
Creatinine, Ser: 0.63 mg/dL (ref 0.44–1.00)
GFR calc Af Amer: >60 mL/min (ref >60)
GFR calc non Af Amer: >60 mL/min (ref >60)
Glucose, Bld: 111 mg/dL — ABNORMAL HIGH (ref 70–99)
Potassium: 3.7 mmol/L (ref 3.5–5.1)
Sodium: 137 mmol/L (ref 135–145)
Total Bilirubin: 0.3 mg/dL (ref 0.3–1.2)
Total Protein: 6.1 g/dL — ABNORMAL LOW (ref 6.5–8.1)

## 2019-02-09 LAB — CBC
HCT: 34.6 % — ABNORMAL LOW (ref 36.0–46.0)
Hemoglobin: 11.2 g/dL — ABNORMAL LOW (ref 12.0–15.0)
MCH: 26.8 pg (ref 26.0–34.0)
MCHC: 32.4 g/dL (ref 30.0–36.0)
MCV: 82.8 fL (ref 80.0–100.0)
Platelets: 227 10*3/uL (ref 150–400)
RBC: 4.18 MIL/uL (ref 3.87–5.11)
RDW: 13.5 % (ref 11.5–15.5)
WBC: 10 10*3/uL (ref 4.0–10.5)
nRBC: 0 % (ref 0.0–0.2)

## 2019-02-09 LAB — LIPASE, BLOOD: Lipase: 54 U/L — ABNORMAL HIGH (ref 11–51)

## 2019-02-09 SURGERY — LAPAROSCOPIC CHOLECYSTECTOMY
Anesthesia: General | Site: Abdomen

## 2019-02-09 MED ORDER — KETOROLAC TROMETHAMINE 30 MG/ML IJ SOLN
INTRAMUSCULAR | Status: DC | PRN
  Administered 2019-02-09: 30 mg via INTRAVENOUS

## 2019-02-09 MED ORDER — DEXAMETHASONE SODIUM PHOSPHATE 10 MG/ML IJ SOLN
INTRAMUSCULAR | Status: DC | PRN
  Administered 2019-02-09: 10 mg via INTRAVENOUS

## 2019-02-09 MED ORDER — ACETAMINOPHEN 500 MG PO TABS: 1000.0000 mg | ORAL_TABLET | Freq: Once | ORAL | Status: DC | PRN

## 2019-02-09 MED ORDER — LIDOCAINE 2% (20 MG/ML) 5 ML SYRINGE
INTRAMUSCULAR | Status: AC
  Filled 2019-02-09: qty 5

## 2019-02-09 MED ORDER — HYDROMORPHONE HCL 1 MG/ML IJ SOLN
INTRAMUSCULAR | Status: AC
  Filled 2019-02-09: qty 0.5

## 2019-02-09 MED ORDER — 0.9 % SODIUM CHLORIDE (POUR BTL) OPTIME
TOPICAL | Status: DC | PRN
  Administered 2019-02-09: 15:00:00 1000 mL

## 2019-02-09 MED ORDER — OXYCODONE HCL 5 MG PO TABS: 5.0000 mg | ORAL_TABLET | ORAL | Status: DC | PRN

## 2019-02-09 MED ORDER — ONDANSETRON HCL 4 MG/2ML IJ SOLN
INTRAMUSCULAR | Status: AC
  Filled 2019-02-09: qty 2

## 2019-02-09 MED ORDER — PROPOFOL 10 MG/ML IV BOLUS
INTRAVENOUS | Status: AC
  Filled 2019-02-09: qty 20

## 2019-02-09 MED ORDER — ACETAMINOPHEN 10 MG/ML IV SOLN
1000.0000 mg | Freq: Once | INTRAVENOUS | Status: DC | PRN
  Administered 2019-02-09: 1000 mg via INTRAVENOUS

## 2019-02-09 MED ORDER — FENTANYL CITRATE (PF) 100 MCG/2ML IJ SOLN
INTRAMUSCULAR | Status: DC | PRN
  Administered 2019-02-09 (×2): 100 ug via INTRAVENOUS
  Administered 2019-02-09: 50 ug via INTRAVENOUS

## 2019-02-09 MED ORDER — SODIUM CHLORIDE 0.9 % IR SOLN
Status: DC | PRN
  Administered 2019-02-09: 1000 mL

## 2019-02-09 MED ORDER — BUPIVACAINE-EPINEPHRINE 0.5% -1:200000 IJ SOLN
INTRAMUSCULAR | Status: DC | PRN
  Administered 2019-02-09: 11 mL

## 2019-02-09 MED ORDER — ROCURONIUM BROMIDE 10 MG/ML (PF) SYRINGE
PREFILLED_SYRINGE | INTRAVENOUS | Status: AC
  Filled 2019-02-09: qty 10

## 2019-02-09 MED ORDER — OXYCODONE HCL 5 MG PO TABS
5.0000 mg | ORAL_TABLET | Freq: Once | ORAL | Status: AC | PRN
  Administered 2019-02-09: 5 mg via ORAL

## 2019-02-09 MED ORDER — FENTANYL CITRATE (PF) 100 MCG/2ML IJ SOLN
INTRAMUSCULAR | Status: AC
  Filled 2019-02-09: qty 2

## 2019-02-09 MED ORDER — FENTANYL CITRATE (PF) 250 MCG/5ML IJ SOLN
INTRAMUSCULAR | Status: AC
  Filled 2019-02-09: qty 5

## 2019-02-09 MED ORDER — MORPHINE SULFATE (PF) 2 MG/ML IV SOLN: 1.0000 mg | INTRAVENOUS | Status: DC | PRN

## 2019-02-09 MED ORDER — HYDROMORPHONE HCL 1 MG/ML IJ SOLN
INTRAMUSCULAR | Status: DC | PRN
  Administered 2019-02-09 (×2): 0.5 mg via INTRAVENOUS

## 2019-02-09 MED ORDER — PROPOFOL 10 MG/ML IV BOLUS
INTRAVENOUS | Status: DC | PRN
  Administered 2019-02-09: 20 mg via INTRAVENOUS
  Administered 2019-02-09: 150 mg via INTRAVENOUS

## 2019-02-09 MED ORDER — ONDANSETRON HCL 4 MG/2ML IJ SOLN
INTRAMUSCULAR | Status: DC | PRN
  Administered 2019-02-09: 4 mg via INTRAVENOUS

## 2019-02-09 MED ORDER — FENTANYL CITRATE (PF) 100 MCG/2ML IJ SOLN
25.0000 ug | INTRAMUSCULAR | Status: DC | PRN
  Administered 2019-02-09: 50 ug via INTRAVENOUS

## 2019-02-09 MED ORDER — LACTATED RINGERS IV SOLN
INTRAVENOUS | Status: DC | PRN
  Administered 2019-02-09 (×2): via INTRAVENOUS

## 2019-02-09 MED ORDER — LIDOCAINE 2% (20 MG/ML) 5 ML SYRINGE
INTRAMUSCULAR | Status: DC | PRN
  Administered 2019-02-09: 60 mg via INTRAVENOUS

## 2019-02-09 MED ORDER — MIDAZOLAM HCL 2 MG/2ML IJ SOLN
INTRAMUSCULAR | Status: AC
  Filled 2019-02-09: qty 2

## 2019-02-09 MED ORDER — MIDAZOLAM HCL 5 MG/5ML IJ SOLN
INTRAMUSCULAR | Status: DC | PRN
  Administered 2019-02-09: 2 mg via INTRAVENOUS

## 2019-02-09 MED ORDER — BUPIVACAINE HCL (PF) 0.25 % IJ SOLN
INTRAMUSCULAR | Status: AC
  Filled 2019-02-09: qty 30

## 2019-02-09 MED ORDER — OXYCODONE HCL 5 MG PO TABS
ORAL_TABLET | ORAL | Status: AC
  Filled 2019-02-09: qty 1

## 2019-02-09 MED ORDER — ONDANSETRON HCL 4 MG/2ML IJ SOLN: 4.0000 mg | Freq: Once | INTRAMUSCULAR | Status: DC

## 2019-02-09 MED ORDER — DEXAMETHASONE SODIUM PHOSPHATE 10 MG/ML IJ SOLN
INTRAMUSCULAR | Status: AC
  Filled 2019-02-09: qty 1

## 2019-02-09 MED ORDER — ACETAMINOPHEN 160 MG/5ML PO SOLN: 1000.0000 mg | Freq: Once | ORAL | Status: DC | PRN

## 2019-02-09 MED ORDER — ROCURONIUM BROMIDE 50 MG/5ML IV SOSY
PREFILLED_SYRINGE | INTRAVENOUS | Status: DC | PRN
  Administered 2019-02-09: 50 mg via INTRAVENOUS
  Administered 2019-02-09: 10 mg via INTRAVENOUS

## 2019-02-09 MED ORDER — SUGAMMADEX SODIUM 200 MG/2ML IV SOLN
INTRAVENOUS | Status: DC | PRN
  Administered 2019-02-09: 200 mg via INTRAVENOUS

## 2019-02-09 MED ORDER — PHENYLEPHRINE 40 MCG/ML (10ML) SYRINGE FOR IV PUSH (FOR BLOOD PRESSURE SUPPORT)
PREFILLED_SYRINGE | INTRAVENOUS | Status: AC
  Filled 2019-02-09: qty 10

## 2019-02-09 MED ORDER — BUPIVACAINE-EPINEPHRINE 0.5% -1:200000 IJ SOLN
INTRAMUSCULAR | Status: AC
  Filled 2019-02-09: qty 1

## 2019-02-09 MED ORDER — SODIUM CHLORIDE 0.9 % IV SOLN
2.0000 g | INTRAVENOUS | Status: AC
  Administered 2019-02-09: 2 g via INTRAVENOUS
  Filled 2019-02-09: qty 2

## 2019-02-09 MED ORDER — OXYCODONE HCL 5 MG/5ML PO SOLN: 5.0000 mg | Freq: Once | ORAL | Status: AC | PRN

## 2019-02-09 MED ORDER — ACETAMINOPHEN 10 MG/ML IV SOLN
INTRAVENOUS | Status: AC
  Filled 2019-02-09: qty 100

## 2019-02-09 MED ORDER — KETOROLAC TROMETHAMINE 30 MG/ML IJ SOLN
INTRAMUSCULAR | Status: AC
  Filled 2019-02-09: qty 1

## 2019-02-09 MED ORDER — METHOCARBAMOL 500 MG PO TABS: 500.0000 mg | ORAL_TABLET | Freq: Three times a day (TID) | ORAL | Status: DC | PRN

## 2019-02-09 SURGICAL SUPPLY — 40 items
APPLIER CLIP 5 13 M/L LIGAMAX5 (MISCELLANEOUS) ×3
BLADE CLIPPER SURG (BLADE) IMPLANT
CANISTER SUCT 3000ML PPV (MISCELLANEOUS) ×3 IMPLANT
CHLORAPREP W/TINT 26 (MISCELLANEOUS) ×3 IMPLANT
CLIP APPLIE 5 13 M/L LIGAMAX5 (MISCELLANEOUS) ×1 IMPLANT
COVER SURGICAL LIGHT HANDLE (MISCELLANEOUS) ×3 IMPLANT
COVER WAND RF STERILE (DRAPES) IMPLANT
DERMABOND ADVANCED (GAUZE/BANDAGES/DRESSINGS) ×2
DERMABOND ADVANCED .7 DNX12 (GAUZE/BANDAGES/DRESSINGS) ×1 IMPLANT
DISSECTOR BLUNT TIP ENDO 5MM (MISCELLANEOUS) IMPLANT
ELECT CAUTERY BLADE 6.4 (BLADE) ×3 IMPLANT
ELECT REM PT RETURN 9FT ADLT (ELECTROSURGICAL) ×3
ELECTRODE REM PT RTRN 9FT ADLT (ELECTROSURGICAL) ×1 IMPLANT
GLOVE BIO SURGEON STRL SZ7.5 (GLOVE) ×3 IMPLANT
GLOVE INDICATOR 8.0 STRL GRN (GLOVE) IMPLANT
GOWN STRL REUS W/ TWL LRG LVL3 (GOWN DISPOSABLE) ×2 IMPLANT
GOWN STRL REUS W/ TWL XL LVL3 (GOWN DISPOSABLE) ×1 IMPLANT
GOWN STRL REUS W/TWL LRG LVL3 (GOWN DISPOSABLE) ×4
GOWN STRL REUS W/TWL XL LVL3 (GOWN DISPOSABLE) ×2
GRASPER SUT TROCAR 14GX15 (MISCELLANEOUS) ×3 IMPLANT
KIT BASIN OR (CUSTOM PROCEDURE TRAY) ×3 IMPLANT
KIT TURNOVER KIT B (KITS) ×3 IMPLANT
NEEDLE INSUFFLATION 14GA 150MM (NEEDLE) ×3 IMPLANT
NS IRRIG 1000ML POUR BTL (IV SOLUTION) ×3 IMPLANT
PAD ARMBOARD 7.5X6 YLW CONV (MISCELLANEOUS) ×3 IMPLANT
PENCIL SMOKE EVACUATOR (MISCELLANEOUS) ×3 IMPLANT
POUCH SPECIMEN RETRIEVAL 10MM (ENDOMECHANICALS) ×3 IMPLANT
SCISSORS LAP 5X35 DISP (ENDOMECHANICALS) ×3 IMPLANT
SET IRRIG TUBING LAPAROSCOPIC (IRRIGATION / IRRIGATOR) ×3 IMPLANT
SET TUBE SMOKE EVAC HIGH FLOW (TUBING) ×3 IMPLANT
SLEEVE ENDOPATH XCEL 5M (ENDOMECHANICALS) ×6 IMPLANT
SPECIMEN JAR SMALL (MISCELLANEOUS) ×3 IMPLANT
SUT MNCRL AB 4-0 PS2 18 (SUTURE) ×6 IMPLANT
SUT VICRYL 0 UR6 27IN ABS (SUTURE) ×3 IMPLANT
TOWEL GREEN STERILE (TOWEL DISPOSABLE) ×3 IMPLANT
TOWEL GREEN STERILE FF (TOWEL DISPOSABLE) ×3 IMPLANT
TRAY LAPAROSCOPIC MC (CUSTOM PROCEDURE TRAY) ×3 IMPLANT
TROCAR XCEL BLUNT TIP 100MML (ENDOMECHANICALS) ×3 IMPLANT
TROCAR XCEL NON-BLD 5MMX100MML (ENDOMECHANICALS) ×3 IMPLANT
WATER STERILE IRR 1000ML POUR (IV SOLUTION) ×3 IMPLANT

## 2019-02-09 NOTE — Progress Notes (Signed)
Pt returned to room 6N17 after surgery. Received report from [place], RN in PACU. See reassessment. Will continue to monitor.

## 2019-02-09 NOTE — Transfer of Care (Signed)
Immediate Anesthesia Transfer of Care Note  Patient: Brandy Kaufman  Procedure(s) Performed: LAPAROSCOPIC CHOLECYSTECTOMY (N/A Abdomen)  Patient Location: PACU  Anesthesia Type:General  Level of Consciousness: awake, alert  and oriented  Airway & Oxygen Therapy: Patient Spontanous Breathing and Patient connected to face mask oxygen  Post-op Assessment: Report given to RN and Post -op Vital signs reviewed and stable  Post vital signs: Reviewed and stable  Last Vitals:  Vitals Value Taken Time  BP 101/70 02/09/19 1637  Temp    Pulse 65 02/09/19 1640  Resp 22 02/09/19 1640  SpO2 100 % 02/09/19 1640  Vitals shown include unvalidated device data.  Last Pain:  Vitals:   02/09/19 1637  TempSrc:   PainSc: (P) Asleep      Patients Stated Pain Goal: 2 (22/33/61 2244)  Complications: No apparent anesthesia complications

## 2019-02-09 NOTE — Plan of Care (Signed)

## 2019-02-09 NOTE — Progress Notes (Signed)
Family Medicine Teaching Service Daily Progress Note Intern Pager: [phone]  Patient name: Brandy Kaufman Medical record number: [id number] Date of birth: 06-07-1990 Age: 28 y.o. Gender: female  Primary Care Provider: Department, [facility] Consultants: GI and gen surg Code Status: full  Pt Overview and Major Events to Date:  10/24 - admitted for cholecystectomy and ERCP 10/25-ERCP 10/26-cholecystectomy planned  Assessment and Plan: Brandy Kaufman is a 28 y.o. female presenting with 3-day history of nausea and vomiting with epigastric abdominal pain due to cholecystitis and choledocholithiasis.Marland Kitchen PMH is significant for no significant medical history  Cholecystitis and choledocholithiasis- pain and nausea improved this AM.  WBC currently 10. Afebrile.  ERCP showed no stone in common bile duct, it had likely already passed. - f/u gen surg recs: cholecystectomy today - f/u GI recs - abx: CTX (10/24-) and flagyl (10/24-)  Microscopic hematuria - UA showed 11-20 rbc/hpf. Urine pregnancy test negative.  - repeat UA on outpatient followup  FEN/GI: NPO Prophylaxis: Lovenox, holding for surgery  Disposition: home  Subjective:  Patient still complains of some right upper quadrant abdominal pain and nausea.  Preparing for cholecystectomy later today  Objective: Temp:  [97.1 F (36.2 C)-98.6 F (37 C)] 98.5 F (36.9 C) (10/26 0546) Pulse Rate:  [45-85] 45 (10/26 0546) Resp:  [12-19] 18 (10/26 0546) BP: (98-136)/(33-80) 102/66 (10/26 0546) SpO2:  [95 %-100 %] 100 % (10/26 0546) Weight:  [79.4 kg] 79.4 kg (10/25 1040)  Physical Exam: General: Alert and oriented in no apparent distress Heart: Regular rate and rhythm with no murmurs appreciated Lungs: CTA bilaterally, no wheezing Abdomen: Bowel sounds present, right upper quadrant abdominal pain on palpation Skin: Warm and dry  Laboratory: Recent Labs  Lab 02/07/19 1637 02/08/19 0436  02/09/19 0326  WBC 6.8 10.7* 10.0  HGB 12.4 11.4* 11.2*  HCT 38.9 36.0 34.6*  PLT 231 216 227   Recent Labs  Lab 02/07/19 1957 02/08/19 0436 02/09/19 0326  NA 141 140 137  K 3.8 3.7 3.7  CL 107 108 108  CO2 24 24 21*  BUN 8 9 11   CREATININE 0.64 0.69 0.63  CALCIUM 8.7* 8.4* 8.2*  PROT 7.3 6.6 6.1*  BILITOT 0.5 0.3 0.3  ALKPHOS 49 43 44  ALT 11 12 13   AST 16 17 14*  GLUCOSE 93 113* 111*    Lurline Del, DO 02/09/2019, 6:26 AM PGY-1, [place] Intern pager: [phone], text pages welcome

## 2019-02-09 NOTE — Discharge Instructions (Addendum)
[facility] ° °Please arrive at least 30 min before your appointment to complete your check in paperwork.  If you are unable to arrive 30 min prior to your appointment time we may have to cancel or reschedule you. °LAPAROSCOPIC SURGERY: POST OP INSTRUCTIONS °Always review your discharge instruction sheet given to you by the facility where your surgery was performed. °IF YOU HAVE DISABILITY OR FAMILY LEAVE FORMS, YOU MUST BRING THEM TO THE OFFICE FOR PROCESSING.   °DO NOT GIVE THEM TO YOUR DOCTOR. ° °PAIN CONTROL ° °1. First take acetaminophen (Tylenol) AND/or ibuprofen (Advil) to control your pain after surgery.  Follow directions on package.  Taking acetaminophen (Tylenol) and/or ibuprofen (Advil) regularly after surgery will help to control your pain and lower the amount of prescription pain medication you may need.  You should not take more than 4,000 mg (4 grams) of acetaminophen (Tylenol) in 24 hours.  You should not take ibuprofen (Advil), aleve, motrin, naprosyn or other NSAIDS if you have a history of stomach ulcers or chronic kidney disease.  °2. A prescription for pain medication may be given to you upon discharge.  Take your pain medication as prescribed, if you still have uncontrolled pain after taking acetaminophen (Tylenol) or ibuprofen (Advil). °3. Use ice packs to help control pain. °4. If you need a refill on your pain medication, please contact your pharmacy.  They will contact our office to request authorization. Prescriptions will not be filled after 5pm or on week-ends. ° °HOME MEDICATIONS °5. Take your usually prescribed medications unless otherwise directed. ° °DIET °6. You should follow a light diet the first few days after arrival home.  Be sure to include lots of fluids daily. Avoid fatty, fried foods.  ° °CONSTIPATION °7. It is common to experience some constipation after surgery and if you are taking pain medication.  Increasing fluid intake and taking a stool  softener (such as Colace) will usually help or prevent this problem from occurring.  A mild laxative (Milk of Magnesia or Miralax) should be taken according to package instructions if there are no bowel movements after 48 hours. ° °WOUND/INCISION CARE °8. Most patients will experience some swelling and bruising in the area of the incisions.  Ice packs will help.  Swelling and bruising can take several days to resolve.  °9. Unless discharge instructions indicate otherwise, follow guidelines below  °a. STERI-STRIPS - you may remove your outer bandages 48 hours after surgery, and you may shower at that time.  You have steri-strips (small skin tapes) in place directly over the incision.  These strips should be left on the skin for 7-10 days.   °b. DERMABOND/SKIN GLUE - you may shower in 24 hours.  The glue will flake off over the next 2-3 weeks. °10. Any sutures or staples will be removed at the office during your follow-up visit. ° °ACTIVITIES °11. You may resume regular (light) daily activities beginning the next day--such as daily self-care, walking, climbing stairs--gradually increasing activities as tolerated.  You may have sexual intercourse when it is comfortable.  Refrain from any heavy lifting or straining until approved by your doctor. °a. You may drive when you are no longer taking prescription pain medication, you can comfortably wear a seatbelt, and you can safely maneuver your car and apply brakes. ° °FOLLOW-UP °12. You should see your doctor in the office for a follow-up appointment approximately 2-3 weeks after your surgery.  You should have been given your post-op/follow-up appointment when   your surgery was scheduled.  If you did not receive a post-op/follow-up appointment, make sure that you call for this appointment within a day or two after you arrive home to insure a convenient appointment time.  OTHER INSTRUCTIONS  WHEN TO CALL YOUR DOCTOR: 1. Fever over 101.0 2. Inability to  urinate 3. Continued bleeding from incision. 4. Increased pain, redness, or drainage from the incision. 5. Increasing abdominal pain  The clinic staff is available to answer your questions during regular business hours.  Please dont hesitate to call and ask to speak to one of the nurses for clinical concerns.  If you have a medical emergency, go to the nearest emergency room or call 911.  A surgeon from [facility] is always on call at the hospital. [address], [place], [place], [place]  [address] ? [address], [place], [place]   [address] [phone] ? [phone] ? FAX [phone]    Discharge instructions: You were admitted for abdominal pain and had your gallbladder removed due to this. Is important that you follow-up with your primary care physician within 1 week of discharge.  Please return if you have any of the following symptoms: Fevers, worsening pain, chest pain, difficulty breathing.

## 2019-02-09 NOTE — Op Note (Signed)
02/09/2019 4:33 PM  PATIENT: Brandy Kaufman  28 y.o. female  Patient Care Team: Department, [facility] as PCP - General Patient, No Pcp Per (General Practice)  PRE-OPERATIVE DIAGNOSIS: Acute cholecystitis  POST-OPERATIVE DIAGNOSIS: Same  PROCEDURE: Laparoscopic cholecystectomy  SURGEON: Stephanie Coup. Jasa Dundon, MD  ASSISTANT: Carlena Bjornstad, PA-C  ANESTHESIA: General endotracheal  EBL: Total I/O In: 1000 [I.V.:1000] Out: 20 [Blood:20]  DRAINS: None  SPECIMEN: Gallbladder  COUNTS: Sponge, needle and instrument counts were reported correct x2 at the conclusion of the operation  DISPOSITION: PACU in satisfactory condition  COMPLICATIONS: None  FINDINGS: Acutely inflamed gallbladder. Omentum adherent to gallbladder and taken down sharply. Filmy adhesions between infundibulum and duodenum. Critical view of safety was obtained prior to clipping or dividing any structures.  INDICATION: Ms. Brandy Kaufman is a very pleasant 28yoF whom presented with RUQ/MEG pain 2 days ago. She was found to have gallstones, thickened gallbladder wall, common bile duct of 9 mm tapering to 6.  Small pericholecystic fluid.  LFTs were normal.  Lipase was normal.  She underwent MRCP which demonstrated no evidence of common bile duct stones.  We discussed options with her moving forward and she opted to pursue surgery. Please refer to notes elsewhere for details regarding this discussion  DESCRIPTION:   The patient was identified & brought into the operating room. She was then positioned supine on the OR table. SCDs were in place and active during the entire case. She then underwent general endotracheal anesthesia. Pressure points were padded. Hair on the abdomen was clipped by the OR team. The abdomen was prepped and draped in the standard sterile fashion. Antibiotics were administered. A surgical timeout was performed and confirmed our plan.   A supraumbilical incision was made. The  umbilical stalk was grasped and retracted outwardly. The supraumbilical fascia was identified and incised. The peritoneal cavity was gently entered bluntly. A purse-string 0 Vicryl suture was placed. The Hasson cannula was inserted into the peritoneal cavity and insufflation with CO2 commenced to . A laparoscope was inserted into the peritoneal cavity and inspection confirmed no evidence of trocar site complications. The patient was then positioned in reverse Trendelenburg with slight left side down. 3 additional 39mm trocars were placed along the right subcostal line - one 31mm port in mid subcostal region, another 16mm port in the right flank near the anterior axillary line, and a third 72mm port in the left subxiphoid region obliquely near the falciform ligament.  The liver and gallbladder were inspected. Omentum was adherent to the intraperitoneal side of the gallbladder and taken down sharply. Adhesions between gallbladder and duodenum were also carefully taken down sharply staying well off the serosa of the duodenum. The gallbladder fundus was grasped and elevated cephalad. An additional grasper was then placed on the infundibulum of the gallbladder and the infundibulum was retracted laterally. Staying high on the gallbladder, the peritoneum on both sides of the gallbladder was opened with hook cautery. Gentle blunt dissection was then employed with a Art gallery manager working down into [employer]. The cystic duct was identified and carefully circumferentially dissected. The cystic artery was also identified and carefully circumferentially dissected. The space between the cystic artery and hepatocystic plate was developed such that a good view of the liver could be seen through a window medial to the cystic artery. The triangle of Calot had been cleared of all fibrofatty tissue. At this point, a critical view of safety was achieved and the only structures visualized was the skeletonized cystic duct  laterally, the skeletonized cystic artery and the liver through the window medial to the artery. No posterior cystic artery was noted  A cholangiogram was not performed at this point as her cystic duct was quite diminutive increasing risk for avulsion with attempted cholangiogram.  The cystic duct and artery were clipped with 2 clips on the patient side and 1 clip on the specimen side. The cystic duct and artery were then divided. The gallbladder was then freed from its remaining attachments to the liver using electrocautery and placed into an endocatch bag. The RUQ was gently irrigated with sterile saline. Hemostasis was then verified. The clips were in good position; the gallbladder fossa was dry. The rest of the abdomen was inspected no injury nor bleeding elsewhere was identified.  The endocatch bag containing the gallbladder was then removed from the umbilical port site and passed off as specimen. The pursestring was tied and an additional 0 Vicryl stitch was placed with the suture passer under direct visualization. The fascia was palpated and noted to be completely closed. The RUQ ports were removed under direct visualization and noted to be hemostatic. The skin of all incision sites was approximated with 4-0 monocryl subcuticular suture and dermabond applied. She was then awakened from anesthesia, extubated, and transferred to a stretcher for transport to PACU in satisfactory condition.

## 2019-02-09 NOTE — Discharge Summary (Addendum)
[facility] Discharge Summary  Patient name: Brandy Kaufman Medical record number: [id number] Date of birth: 17-Jul-1990 Age: 28 y.o. Gender: female Date of Admission: 02/07/2019  Date of Discharge: 02/10/2019 Admitting Physician: Zenia Resides, MD  Primary Care Provider: Department, [facility] Consultants: General Surgery   Indication for Hospitalization: nausea, emesis, and abdominal pain 2/2 cholescystitis   Discharge Diagnoses/Problem List:  Active Problems:   Depression   Cholecystitis  Disposition: Home  Discharge Condition: Stable  Discharge Exam:  General: Alert and oriented in no apparent distress Heart: Regular rate and rhythm with no murmurs appreciated Lungs: CTA bilaterally, no wheezing Abdomen: Bowel sounds present, some mild abdominal discomfort diffusely without acute/peritoneal findings.  Laparoscopic surgical incisions present and clean appearing. Skin: Warm and dry  Brief Hospital Course:  Brandy Kaufman is a 28 y.o. female who presented with nausea, emesis and abdominal pain found to have cholecystitis and choledocolithiasis.  Cholecystitis and choledocholithiasis Patient presented after three days of nausea, emesis and epigastric pain with a history of mulitple similar episodes like this in the past 2 years. RUQ ultrasound showed gallstones in neck of gallbladder with pericholecystic fluid and thickened gallbladder wall as well as dilation of the common bile duct to 51mm. Patient was treated with Zofran, started on 1/2 maintenance IVFs and fentanyl followed by consults for general surgery as well as GI. Patient underwent ERCP with GI which showed non-dilated extrahepatic biliary tree and patent cystic duct with cholelithiasis with a CBD stone that has likely passed into the duodenum at the time of the procedure. General surgery then planned for cholecystectomy on 02/09/19. Patient underwent a  successful cholecystectomy was subsequently able to meet post operative goals prior to discharge home. Due to some pain after surgery and concern for pancreatitis due to previous MRCP the patient's lipase was trended which initially increased however resolved to normal level of 24 later in the day of discharge.  Microscopic Hematuria Patient had U/A on admission showing 11-20rbc/hpf with negative urine pregancy test.  Recommend repeat urinalysis in the outpatient setting to follow-up on microscopic hematuria.   Issues for Follow Up:  1. Repeat U/A for microscopic hematuria found on admission.   Significant Procedures:  ERCP 02/08/19 Cholecystectomy 02/09/19  Significant Labs and Imaging:  Recent Labs  Lab 02/08/19 0436 02/09/19 0326 02/10/19 0734  WBC 10.7* 10.0 9.9  HGB 11.4* 11.2* 10.4*  HCT 36.0 34.6* 31.7*  PLT 216 227 198   Recent Labs  Lab 02/07/19 1957 02/08/19 0436 02/09/19 0326 02/10/19 0734  NA 141 140 137 138  K 3.8 3.7 3.7 3.6  CL 107 108 108 106  CO2 24 24 21* 23  GLUCOSE 93 113* 111* 90  BUN 8 9 11 11   CREATININE 0.64 0.69 0.63 0.62  CALCIUM 8.7* 8.4* 8.2* 8.0*  ALKPHOS 49 43 44  --   AST 16 17 14*  --   ALT 11 12 13   --   ALBUMIN 4.1 3.7 3.3*  --      Dg Ercp Biliary & Pancreatic Ducts  Result Date: 02/09/2019 CLINICAL DATA:  28 year old female with mild right upper quadrant pain and intermittent nausea EXAM: ERCP TECHNIQUE: Multiple spot images obtained with the fluoroscopic device and submitted for interpretation post-procedure. FLUOROSCOPY TIME:  Fluoroscopy Time:  2 minutes 38 seconds reported COMPARISON:  Abdominal ultrasound 02/07/2019 FINDINGS: A total of 4 saved images are submitted for review. The images demonstrate a flexible endoscope in the descending duodenum with wire  cannulation of the pancreatic followed by the common bile duct. Cholangiogram was subsequently performed. No evidence of biliary ductal dilatation, stenosis or stricture.  Questionable filling defect which may represent choledocholithiasis. Filling defects are present within the gallbladder consistent with cholelithiasis. The cystic duct remains patent. IMPRESSION: 1. Cholelithiasis 2. Choledocholithiasis. These images were submitted for radiologic interpretation only. Please see the procedural report for the amount of contrast and the fluoroscopy time utilized. Electronically Signed   By: Malachy Moan M.D.   On: 02/09/2019 08:12   US Abdomen Limited Ruq  Result Date: 02/07/2019 CLINICAL DATA:  Upper abdominal pain EXAM: ULTRASOUND ABDOMEN LIMITED RIGHT UPPER QUADRANT COMPARISON:  None. FINDINGS: Gallbladder: Multiple echogenic, shadowing gallstones are present within the gallbladder neck largest measuring up to 17 mm in maximal diameter. Additional nonshadowing stone or polyp is seen in the mid gallbladder measuring up to 4 mm. Small amount of pericholecystic fluid is present. Gallbladder wall is mildly thickened at 5 mm. Sonographic Eulah Pont sign is reportedly positive. Common bile duct: Diameter: 9 mm, dilated. There is an echogenic focus in the mid extrahepatic CBD measuring up to 6 mm concerning for an intraductal gallstones Liver: Diffusely increased hepatic echogenicity and diminished posterior through transmission compatible with hepatic steatosis. No focal lesion identified. Portal vein is patent on color Doppler imaging with normal direction of blood flow towards the liver. Other: None. IMPRESSION: Cholelithiasis and gallbladder polyps with sonographic evidence of acute cholecystitis. Extrahepatic biliary ductal dilatation with choledocholithiasis. Mild hepatic steatosis Electronically Signed   By: Kreg Shropshire M.D.   On: 02/07/2019 19:34    Results/Tests Pending at Time of Discharge: None  Discharge Medications:  Allergies as of 02/10/2019   No Known Allergies     Medication List    TAKE these medications   acetaminophen 325 MG tablet Commonly known  as: TYLENOL Take 2 tablets (650 mg total) by mouth every 4 (four) hours as needed for mild pain (or Fever >/= 101).   oxyCODONE 5 MG immediate release tablet Commonly known as: Oxy IR/ROXICODONE Take 1 tablet (5 mg total) by mouth every 4 (four) hours as needed for moderate pain or severe pain.       Discharge Instructions: Please refer to Patient Instructions section of EMR for full details.  Patient was counseled important signs and symptoms that should prompt return to medical care, changes in medications, dietary instructions, activity restrictions, and follow up appointments.   Follow-Up Appointments: Follow-up Information    [facility], [place]. Go on 03/03/2019.   Specialty: General Surgery Why: Your appointment is 11/17 at 8:30 am Please arrive 30 minutes prior to your appointment to check in and fill out paperwork. Bring photo ID and insurance information. Contact information: [address] [place] [address] [phone]       Department, [facility]. Schedule an appointment as soon as possible for a visit in 1 week(s).   Contact information: [address] [place] [address] [phone]           Jackelyn Poling, DO 02/10/2019, 3:36 PM PGY-1, [facility]  FPTS Upper-Level Resident Addendum  Mirian Mo MD PGY-2, [facility] 02/10/2019 6:08 PM

## 2019-02-09 NOTE — Anesthesia Preprocedure Evaluation (Signed)
Anesthesia Evaluation  Patient identified by MRN, date of birth, ID band Patient awake    Reviewed: Allergy & Precautions, NPO status , Patient's Chart, lab work & pertinent test results  Airway Mallampati: II  TM Distance: >3 FB     Dental   Pulmonary    breath sounds clear to auscultation       Cardiovascular negative cardio ROS   Rhythm:Regular Rate:Normal     Neuro/Psych    GI/Hepatic Neg liver ROS, History noted. CG   Endo/Other  negative endocrine ROS  Renal/GU negative Renal ROS     Musculoskeletal   Abdominal   Peds  Hematology   Anesthesia Other Findings   Reproductive/Obstetrics                             Anesthesia Physical Anesthesia Plan  ASA: II  Anesthesia Plan: General   Post-op Pain Management:    Induction: Intravenous  PONV Risk Score and Plan: Dexamethasone, Ondansetron and Midazolam  Airway Management Planned: Oral ETT  Additional Equipment:   Intra-op Plan:   Post-operative Plan: Extubation in OR  Informed Consent: I have reviewed the patients History and Physical, chart, labs and discussed the procedure including the risks, benefits and alternatives for the proposed anesthesia with the patient or authorized representative who has indicated his/her understanding and acceptance.     Dental advisory given  Plan Discussed with: CRNA and Anesthesiologist  Anesthesia Plan Comments:         Anesthesia Quick Evaluation

## 2019-02-09 NOTE — Progress Notes (Signed)
[facility] Progress Note  1 Day Post-Op  Subjective: CC-  S/p ERCP yesterday. States that her abdominal pain is a little less since the procedure yesterday. Mild RUQ/epigastric pain. Denies n/v. Lipase 54.  Prior surgeries: tubal ligation, c section Works as a [employer] copy: Vital signs in last 24 hours: Temp:  [97.1 F (36.2 C)-98.6 F (37 C)] 98.5 F (36.9 C) (10/26 0546) Pulse Rate:  [45-85] 45 (10/26 0546) Resp:  [12-19] 18 (10/26 0546) BP: (98-136)/(33-80) 102/66 (10/26 0546) SpO2:  [95 %-100 %] 100 % (10/26 0546) Weight:  [79.4 kg] 79.4 kg (10/26 0500) Last BM Date: 02/07/19  Intake/Output from previous day: 10/25 0701 - 10/26 0700 In: 1000 [I.V.:900; IV Piggyback:100] Out: 0  Intake/Output this shift: No intake/output data recorded.  PE: Gen:  Alert, NAD, pleasant HEENT: EOM's intact, pupils equal and round Card:  RRR Pulm:  CTAB, no W/R/R, effort normal Abd: Soft, ND, +BS, no HSM, subjective RUQ/epigastric TTP Skin: no rashes noted, warm and dry  Lab Results:  Recent Labs    02/08/19 0436 02/09/19 0326  WBC 10.7* 10.0  HGB 11.4* 11.2*  HCT 36.0 34.6*  PLT 216 227   BMET Recent Labs    02/08/19 0436 02/09/19 0326  NA 140 137  K 3.7 3.7  CL 108 108  CO2 24 21*  GLUCOSE 113* 111*  BUN 9 11  CREATININE 0.69 0.63  CALCIUM 8.4* 8.2*   PT/INR Recent Labs    02/08/19 0436  LABPROT 14.5  INR 1.1   CMP     Component Value Date/Time   NA 137 02/09/2019 0326   K 3.7 02/09/2019 0326   CL 108 02/09/2019 0326   CO2 21 (L) 02/09/2019 0326   GLUCOSE 111 (H) 02/09/2019 0326   BUN 11 02/09/2019 0326   CREATININE 0.63 02/09/2019 0326   CALCIUM 8.2 (L) 02/09/2019 0326   PROT 6.1 (L) 02/09/2019 0326   ALBUMIN 3.3 (L) 02/09/2019 0326   AST 14 (L) 02/09/2019 0326   ALT 13 02/09/2019 0326   ALKPHOS 44 02/09/2019 0326   BILITOT 0.3 02/09/2019 0326   GFRNONAA >60 02/09/2019 0326   GFRAA >60 02/09/2019 0326   Lipase      Component Value Date/Time   LIPASE 54 (H) 02/09/2019 0326       Studies/Results: US Abdomen Limited Ruq  Result Date: 02/07/2019 CLINICAL DATA:  Upper abdominal pain EXAM: ULTRASOUND ABDOMEN LIMITED RIGHT UPPER QUADRANT COMPARISON:  None. FINDINGS: Gallbladder: Multiple echogenic, shadowing gallstones are present within the gallbladder neck largest measuring up to 17 mm in maximal diameter. Additional nonshadowing stone or polyp is seen in the mid gallbladder measuring up to 4 mm. Small amount of pericholecystic fluid is present. Gallbladder wall is mildly thickened at 5 mm. Sonographic Eulah Pont sign is reportedly positive. Common bile duct: Diameter: 9 mm, dilated. There is an echogenic focus in the mid extrahepatic CBD measuring up to 6 mm concerning for an intraductal gallstones Liver: Diffusely increased hepatic echogenicity and diminished posterior through transmission compatible with hepatic steatosis. No focal lesion identified. Portal vein is patent on color Doppler imaging with normal direction of blood flow towards the liver. Other: None. IMPRESSION: Cholelithiasis and gallbladder polyps with sonographic evidence of acute cholecystitis. Extrahepatic biliary ductal dilatation with choledocholithiasis. Mild hepatic steatosis Electronically Signed   By: Kreg Shropshire M.D.   On: 02/07/2019 19:34    Anti-infectives: Anti-infectives (From admission, onward)   Start     Dose/Rate Route Frequency Ordered Stop  02/07/19 2215  cefTRIAXone (ROCEPHIN) 2 g in sodium chloride 0.9 % 100 mL IVPB     2 g 200 mL/hr over 30 Minutes Intravenous Every 24 hours 02/07/19 2205     02/07/19 2215  metroNIDAZOLE (FLAGYL) IVPB 500 mg     500 mg 100 mL/hr over 60 Minutes Intravenous Every 8 hours 02/07/19 2205         Assessment/Plan Choledocholithiasis and cholecystitis - s/p ERCP 10/25: Bilairy sphincterotomy performed for distal CBD meniscus sign - Plan for laparoscopic cholecystectomy today.  FEN:  NPO, IVF VTE: SCDs ID: rocephin/flagyl 10/24>>   LOS: 2 days    Wellington Hampshire, [facility] Surgery 02/09/2019, 8:06 AM Please see Amion for pager number during day hours 7:00am-4:30pm

## 2019-02-09 NOTE — Progress Notes (Signed)
Progress Note   Subjective  Chief Complaint: Bile duct stone, abdominal pain  Patient underwent ERCP 02/08/2019 with no stone in the duct.  Plans to have cholecystectomy today with surgery.  This morning, the patient tells me that she is having some epigastric pain which radiates through to her back, so much so that it is uncomfortable for her to lay back in her bed.  Tells me this is "somewhat normal during these episodes".  Nausea seems under control at the moment.  Denies any other complaints or concerns.   Objective   Vital signs in last 24 hours: Temp:  [97.1 F (36.2 C)-98.6 F (37 C)] 98.5 F (36.9 C) (10/26 0546) Pulse Rate:  [45-85] 45 (10/26 0546) Resp:  [12-19] 18 (10/26 0546) BP: (98-136)/(33-80) 102/66 (10/26 0546) SpO2:  [95 %-100 %] 100 % (10/26 0546) Weight:  [79.4 kg] 79.4 kg (10/26 0500) Last BM Date: 02/07/19 General:   hispanic female in NAD Heart:  Regular rate and rhythm; no murmurs Lungs: Respirations even and unlabored, lungs CTA bilaterally Abdomen:  Soft, marked epigastric ttp and nondistended. Normal bowel sounds. Extremities:  Without edema. Neurologic:  Alert and oriented,  grossly normal neurologically. Psych:  Cooperative. Normal mood and affect.  Intake/Output from previous day: 10/25 0701 - 10/26 0700 In: 1000 [I.V.:900; IV Piggyback:100] Out: 0   Lab Results: Recent Labs    02/07/19 1637 02/08/19 0436 02/09/19 0326  WBC 6.8 10.7* 10.0  HGB 12.4 11.4* 11.2*  HCT 38.9 36.0 34.6*  PLT 231 216 227   BMET Recent Labs    02/07/19 1957 02/08/19 0436 02/09/19 0326  NA 141 140 137  K 3.8 3.7 3.7  CL 107 108 108  CO2 24 24 21*  GLUCOSE 93 113* 111*  BUN 8 9 11   CREATININE 0.64 0.69 0.63  CALCIUM 8.7* 8.4* 8.2*   LFT Recent Labs    02/09/19 0326  PROT 6.1*  ALBUMIN 3.3*  AST 14*  ALT 13  ALKPHOS 44  BILITOT 0.3   PT/INR Recent Labs    02/08/19 0436  LABPROT 14.5  INR 1.1   Lipase     Component Value Date/Time    LIPASE 54 (H) 02/09/2019 0326     Studies/Results: Dg Ercp Biliary & Pancreatic Ducts  Result Date: 02/09/2019 CLINICAL DATA:  28 year old female with mild right upper quadrant pain and intermittent nausea EXAM: ERCP TECHNIQUE: Multiple spot images obtained with the fluoroscopic device and submitted for interpretation post-procedure. FLUOROSCOPY TIME:  Fluoroscopy Time:  2 minutes 38 seconds reported COMPARISON:  Abdominal ultrasound 02/07/2019 FINDINGS: A total of 4 saved images are submitted for review. The images demonstrate a flexible endoscope in the descending duodenum with wire cannulation of the pancreatic followed by the common bile duct. Cholangiogram was subsequently performed. No evidence of biliary ductal dilatation, stenosis or stricture. Questionable filling defect which may represent choledocholithiasis. Filling defects are present within the gallbladder consistent with cholelithiasis. The cystic duct remains patent. IMPRESSION: 1. Cholelithiasis 2. Choledocholithiasis. These images were submitted for radiologic interpretation only. Please see the procedural report for the amount of contrast and the fluoroscopy time utilized. Electronically Signed   By: Jacqulynn Cadet M.D.   On: 02/09/2019 08:12   US Abdomen Limited Ruq  Result Date: 02/07/2019 CLINICAL DATA:  Upper abdominal pain EXAM: ULTRASOUND ABDOMEN LIMITED RIGHT UPPER QUADRANT COMPARISON:  None. FINDINGS: Gallbladder: Multiple echogenic, shadowing gallstones are present within the gallbladder neck largest measuring up to 17 mm in maximal diameter. Additional  nonshadowing stone or polyp is seen in the mid gallbladder measuring up to 4 mm. Small amount of pericholecystic fluid is present. Gallbladder wall is mildly thickened at 5 mm. Sonographic Eulah Pont sign is reportedly positive. Common bile duct: Diameter: 9 mm, dilated. There is an echogenic focus in the mid extrahepatic CBD measuring up to 6 mm concerning for an  intraductal gallstones Liver: Diffusely increased hepatic echogenicity and diminished posterior through transmission compatible with hepatic steatosis. No focal lesion identified. Portal vein is patent on color Doppler imaging with normal direction of blood flow towards the liver. Other: None. IMPRESSION: Cholelithiasis and gallbladder polyps with sonographic evidence of acute cholecystitis. Extrahepatic biliary ductal dilatation with choledocholithiasis. Mild hepatic steatosis Electronically Signed   By: Kreg Shropshire M.D.   On: 02/07/2019 19:34     Assessment / Plan:    Assessment: 1.  Choledocholithiasis: Status post ERCP 02/08/2019 with no stone found in her duct, plans for cholecystectomy today with surgery 2.  Cholecystitis  Plan: 1.  Lipase is elevated today from 25--> 54, with increase in epigastric pain, concern for post ERCP pancreatitis. 2.  Continue fluids and pain control 3.  Patient will undergo cholecystectomy today, diet per them, but would not advance beyond clears 4.  We will follow again tomorrow 5.  Please await any further recommendations from Dr. Meridee Score later today  Thank you for your kind consultation, we will continue to follow.   LOS: 2 days   Unk Lightning  02/09/2019, 10:13 AM

## 2019-02-09 NOTE — Anesthesia Procedure Notes (Signed)
Procedure Name: Intubation Date/Time: 02/09/2019 2:54 PM Performed by: Babs Bertin, CRNA Pre-anesthesia Checklist: Patient identified, Emergency Drugs available, Suction available and Patient being monitored Patient Re-evaluated:Patient Re-evaluated prior to induction Oxygen Delivery Method: Circle System Utilized Preoxygenation: Pre-oxygenation with 100% oxygen Induction Type: IV induction Ventilation: Mask ventilation without difficulty Laryngoscope Size: Mac and 3 Grade View: Grade I Tube type: Oral Tube size: 7.0 mm Number of attempts: 1 Airway Equipment and Method: Stylet and Oral airway Placement Confirmation: ETT inserted through vocal cords under direct vision,  positive ETCO2 and breath sounds checked- equal and bilateral Secured at: 21 cm Tube secured with: Tape Dental Injury: Teeth and Oropharynx as per pre-operative assessment

## 2019-02-09 NOTE — Progress Notes (Signed)
Pre-op report called and given to Robby, RN in Short Stay. All questions answered to satisfaction. OR personnel to transport pt via bed. 

## 2019-02-10 ENCOUNTER — Encounter (HOSPITAL_COMMUNITY): Payer: Self-pay | Admitting: Surgery

## 2019-02-10 LAB — BASIC METABOLIC PANEL
Anion gap: 9 (ref 5–15)
BUN: 11 mg/dL (ref 6–20)
CO2: 23 mmol/L (ref 22–32)
Calcium: 8 mg/dL — ABNORMAL LOW (ref 8.9–10.3)
Chloride: 106 mmol/L (ref 98–111)
Creatinine, Ser: 0.62 mg/dL (ref 0.44–1.00)
GFR calc Af Amer: >60 mL/min (ref >60)
GFR calc non Af Amer: >60 mL/min (ref >60)
Glucose, Bld: 90 mg/dL (ref 70–99)
Potassium: 3.6 mmol/L (ref 3.5–5.1)
Sodium: 138 mmol/L (ref 135–145)

## 2019-02-10 LAB — CBC
HCT: 31.7 % — ABNORMAL LOW (ref 36.0–46.0)
Hemoglobin: 10.4 g/dL — ABNORMAL LOW (ref 12.0–15.0)
MCH: 27.3 pg (ref 26.0–34.0)
MCHC: 32.8 g/dL (ref 30.0–36.0)
MCV: 83.2 fL (ref 80.0–100.0)
Platelets: 198 10*3/uL (ref 150–400)
RBC: 3.81 MIL/uL — ABNORMAL LOW (ref 3.87–5.11)
RDW: 13.6 % (ref 11.5–15.5)
WBC: 9.9 10*3/uL (ref 4.0–10.5)
nRBC: 0 % (ref 0.0–0.2)

## 2019-02-10 LAB — LIPASE, BLOOD: Lipase: 24 U/L (ref 11–51)

## 2019-02-10 MED ORDER — ACETAMINOPHEN 325 MG PO TABS: 650.0000 mg | ORAL_TABLET | ORAL | Status: AC | PRN

## 2019-02-10 MED ORDER — OXYCODONE HCL 5 MG PO TABS: 5.0000 mg | ORAL_TABLET | ORAL | 0 refills | Status: AC | PRN

## 2019-02-10 MED ORDER — CELECOXIB 200 MG PO CAPS: 200.0000 mg | ORAL_CAPSULE | ORAL | Status: DC

## 2019-02-10 MED ORDER — ACETAMINOPHEN 500 MG PO TABS: 1000.0000 mg | ORAL_TABLET | ORAL | Status: DC

## 2019-02-10 MED ORDER — ENOXAPARIN SODIUM 40 MG/0.4ML ~~LOC~~ SOLN
40.0000 mg | SUBCUTANEOUS | Status: DC
  Administered 2019-02-10: 40 mg via SUBCUTANEOUS
  Filled 2019-02-10: qty 0.4

## 2019-02-10 MED ORDER — CHLORHEXIDINE GLUCONATE CLOTH 2 % EX PADS: 6.0000 | MEDICATED_PAD | Freq: Once | CUTANEOUS | Status: DC

## 2019-02-10 MED ORDER — GABAPENTIN 300 MG PO CAPS: 300.0000 mg | ORAL_CAPSULE | ORAL | Status: DC

## 2019-02-10 MED FILL — oxyCODONE HCL 5 MG TABS: 5 | 3 days supply | Qty: 15 | Fill #0

## 2019-02-10 NOTE — Progress Notes (Signed)
Discharged home today, patient's mother driving her home. Personal belongings,discharged instructions given to patient. Take home medications given via transition of care pharmacy. Verbalized understanding of instructions

## 2019-02-10 NOTE — Anesthesia Postprocedure Evaluation (Signed)
Anesthesia Post Note  Patient: Brandy Kaufman  Procedure(s) Performed: LAPAROSCOPIC CHOLECYSTECTOMY (N/A Abdomen)     Patient location during evaluation: PACU Anesthesia Type: General Level of consciousness: awake and alert Pain management: pain level controlled Vital Signs Assessment: post-procedure vital signs reviewed and stable Respiratory status: spontaneous breathing, nonlabored ventilation, respiratory function stable and patient connected to nasal cannula oxygen Cardiovascular status: blood pressure returned to baseline and stable Postop Assessment: no apparent nausea or vomiting Anesthetic complications: no    Last Vitals:  Vitals:   02/10/19 0258 02/10/19 0554  BP: 100/64 (!) 93/58  Pulse: (!) 54 (!) 54  Resp: 18 17  Temp: 37 C 37 C  SpO2: 97% 97%    Last Pain:  Vitals:   02/10/19 0554  TempSrc: Oral  PainSc:                  [name]

## 2019-02-10 NOTE — Progress Notes (Signed)
Progress Note   Subjective  Chief Complaint: Bile duct stone, abdominal pain  This morning, patient tells me she is drowsy and slightly nauseous.  She tolerated a small amount of clears this morning.  Explains that her epigastric pain has actually increased overnight and is still radiating through to her back.   Objective   Vital signs in last 24 hours: Temp:  [97.5 F (36.4 C)-98.6 F (37 C)] 98.6 F (37 C) (10/27 0554) Pulse Rate:  [48-69] 54 (10/27 0554) Resp:  [15-20] 17 (10/27 0554) BP: (93-106)/(58-75) 93/58 (10/27 0554) SpO2:  [94 %-97 %] 97 % (10/27 0554) Weight:  [79.4 kg] 79.4 kg (10/26 1348) Last BM Date: 02/07/19 General:    Hispanic female in NAD Heart:  Regular rate and rhythm; no murmurs Lungs: Respirations even and unlabored, lungs CTA bilaterally Abdomen:  Soft, marked epigastric TTP with involuntary guarding and nondistended. Normal bowel sounds. Extremities:  Without edema. Neurologic:  Alert and oriented,  grossly normal neurologically. Psych:  Cooperative. Normal mood and affect.  Intake/Output from previous day: 10/26 0701 - 10/27 0700 In: 1680 [P.O.:180; I.V.:1500] Out: 420 [Urine:400; Blood:20]  Lab Results: Recent Labs    02/08/19 0436 02/09/19 0326 02/10/19 0734  WBC 10.7* 10.0 9.9  HGB 11.4* 11.2* 10.4*  HCT 36.0 34.6* 31.7*  PLT 216 227 198   BMET Recent Labs    02/08/19 0436 02/09/19 0326 02/10/19 0734  NA 140 137 138  K 3.7 3.7 3.6  CL 108 108 106  CO2 24 21* 23  GLUCOSE 113* 111* 90  BUN 9 11 11   CREATININE 0.69 0.63 0.62  CALCIUM 8.4* 8.2* 8.0*   LFT Recent Labs    02/09/19 0326  PROT 6.1*  ALBUMIN 3.3*  AST 14*  ALT 13  ALKPHOS 44  BILITOT 0.3   PT/INR Recent Labs    02/08/19 0436  LABPROT 14.5  INR 1.1    Studies/Results: Dg Ercp Biliary & Pancreatic Ducts  Result Date: 02/09/2019 CLINICAL DATA:  28 year old female with mild right upper quadrant pain and intermittent nausea EXAM: ERCP TECHNIQUE:  Multiple spot images obtained with the fluoroscopic device and submitted for interpretation post-procedure. FLUOROSCOPY TIME:  Fluoroscopy Time:  2 minutes 38 seconds reported COMPARISON:  Abdominal ultrasound 02/07/2019 FINDINGS: A total of 4 saved images are submitted for review. The images demonstrate a flexible endoscope in the descending duodenum with wire cannulation of the pancreatic followed by the common bile duct. Cholangiogram was subsequently performed. No evidence of biliary ductal dilatation, stenosis or stricture. Questionable filling defect which may represent choledocholithiasis. Filling defects are present within the gallbladder consistent with cholelithiasis. The cystic duct remains patent. IMPRESSION: 1. Cholelithiasis 2. Choledocholithiasis. These images were submitted for radiologic interpretation only. Please see the procedural report for the amount of contrast and the fluoroscopy time utilized. Electronically Signed   By: Jacqulynn Cadet M.D.   On: 02/09/2019 08:12     Assessment / Plan:   Assessment: 1.  Choledocholithiasis: Status post ERCP 02/08/2019 with no stone found in her duct, cholecystectomy 02/09/2019, increasing epigastric pain, hard to discern if s/p surgery yesterday or pancreatitis 2.  Cholecystitis  Plan: 1.  Lipase yesterday went from 25--> 54, now having an increase in epigastric pain, reordered lipase this morning 2.  Will likely need to continue fluids and pain control 3.  Keep patient on a clear liquid diet for now 4.  Please await any further recommendations from Dr. Rush Landmark later today  Thank you for kind  consultation, we will continue to follow   LOS: 3 days   Unk Lightning  02/10/2019, 10:02 AM

## 2019-02-10 NOTE — Progress Notes (Signed)
Family Medicine Teaching Service Daily Progress Note Intern Pager: [phone]  Patient name: Brandy Kaufman Medical record number: [id number] Date of birth: 1990-06-02 Age: 28 y.o. Gender: female  Primary Care Provider: Department, [facility] Consultants: GI and gen surg Code Status: full  Pt Overview and Major Events to Date:  10/24 - admitted for cholecystectomy and ERCP 10/25-ERCP 10/26-cholecystectomy planned  Assessment and Plan: Brandy Kaufman is a 28 y.o. female presenting with 3-day history of nausea and vomiting with epigastric abdominal pain due to cholecystitis and choledocholithiasis.Brandy Kaufman PMH is significant for no significant medical history  Cholecystitis and choledocholithiasis S/p cholecystectomy 10/26.  WBC currently 9.9. Afebrile.  ERCP showed no stone in common bile duct, it had likely already passed. - f/u gen surg recs-stable for discharge - f/u GI recs-plan to see patient today 10/27. - abx: CTX (10/24-10/25) and flagyl (10/24-10/26)  Microscopic hematuria - UA showed 11-20 rbc/hpf. Urine pregnancy test negative.  - repeat UA on outpatient followup  FEN/GI: Clear liquid Prophylaxis: Lovenox  Disposition: home  Subjective:  Patient states she feels her diet is progressing well at this point after having a laparoscopic cholecystectomy yesterday.  She only endorses some mild abdominal discomfort diffusely after her procedure.  Objective: Temp:  [97.5 F (36.4 C)-98.6 F (37 C)] 98.6 F (37 C) (10/27 0554) Pulse Rate:  [48-69] 54 (10/27 0554) Resp:  [15-20] 17 (10/27 0554) BP: (93-106)/(58-75) 93/58 (10/27 0554) SpO2:  [94 %-97 %] 97 % (10/27 0554) Weight:  [79.4 kg] 79.4 kg (10/26 1348)  Physical Exam: General: Alert and oriented in no apparent distress Heart: Regular rate and rhythm with no murmurs appreciated Lungs: CTA bilaterally, no wheezing Abdomen: Bowel sounds present, some mild abdominal discomfort diffusely  without acute/peritoneal findings.  Laboratory: Recent Labs  Lab 02/07/19 1637 02/08/19 0436 02/09/19 0326  WBC 6.8 10.7* 10.0  HGB 12.4 11.4* 11.2*  HCT 38.9 36.0 34.6*  PLT 231 216 227   Recent Labs  Lab 02/07/19 1957 02/08/19 0436 02/09/19 0326  NA 141 140 137  K 3.8 3.7 3.7  CL 107 108 108  CO2 24 24 21*  BUN 8 9 11   CREATININE 0.64 0.69 0.63  CALCIUM 8.7* 8.4* 8.2*  PROT 7.3 6.6 6.1*  BILITOT 0.5 0.3 0.3  ALKPHOS 49 43 44  ALT 11 12 13   AST 16 17 14*  GLUCOSE 93 113* 111*    Brandy Del, DO 02/10/2019, 6:26 AM PGY-1, [place] Intern pager: [phone], text pages welcome

## 2019-02-10 NOTE — Progress Notes (Signed)
Patient ID: Brandy Kaufman, female   DOB: 1991/01/18, 28 y.o.   MRN: [id number]    1 Day Post-Op  Subjective: Some pain, but well controlled with tylenol.  Tolerating liquids and getting ready to eat her breakfast.    Objective: Vital signs in last 24 hours: Temp:  [97.5 F (36.4 C)-98.6 F (37 C)] 98.6 F (37 C) (10/27 0554) Pulse Rate:  [48-69] 54 (10/27 0554) Resp:  [15-20] 17 (10/27 0554) BP: (93-106)/(58-75) 93/58 (10/27 0554) SpO2:  [94 %-97 %] 97 % (10/27 0554) Weight:  [79.4 kg] 79.4 kg (10/26 1348) Last BM Date: 02/07/19  Intake/Output from previous day: 10/26 0701 - 10/27 0700 In: 1680 [P.O.:180; I.V.:1500] Out: 420 [Urine:400; Blood:20] Intake/Output this shift: No intake/output data recorded.  PE: Abd: soft, appropriately tender, incisions c/d/i, +BS, ND  Lab Results:  Recent Labs    02/09/19 0326 02/10/19 0734  WBC 10.0 9.9  HGB 11.2* 10.4*  HCT 34.6* 31.7*  PLT 227 198   BMET Recent Labs    02/09/19 0326 02/10/19 0734  NA 137 138  K 3.7 3.6  CL 108 106  CO2 21* 23  GLUCOSE 111* 90  BUN 11 11  CREATININE 0.63 0.62  CALCIUM 8.2* 8.0*   PT/INR Recent Labs    02/08/19 0436  LABPROT 14.5  INR 1.1   CMP     Component Value Date/Time   NA 138 02/10/2019 0734   K 3.6 02/10/2019 0734   CL 106 02/10/2019 0734   CO2 23 02/10/2019 0734   GLUCOSE 90 02/10/2019 0734   BUN 11 02/10/2019 0734   CREATININE 0.62 02/10/2019 0734   CALCIUM 8.0 (L) 02/10/2019 0734   PROT 6.1 (L) 02/09/2019 0326   ALBUMIN 3.3 (L) 02/09/2019 0326   AST 14 (L) 02/09/2019 0326   ALT 13 02/09/2019 0326   ALKPHOS 44 02/09/2019 0326   BILITOT 0.3 02/09/2019 0326   GFRNONAA >60 02/10/2019 0734   GFRAA >60 02/10/2019 0734   Lipase     Component Value Date/Time   LIPASE 54 (H) 02/09/2019 0326       Studies/Results: Dg Ercp Biliary & Pancreatic Ducts  Result Date: 02/09/2019 CLINICAL DATA:  28 year old female with mild right upper quadrant pain and  intermittent nausea EXAM: ERCP TECHNIQUE: Multiple spot images obtained with the fluoroscopic device and submitted for interpretation post-procedure. FLUOROSCOPY TIME:  Fluoroscopy Time:  2 minutes 38 seconds reported COMPARISON:  Abdominal ultrasound 02/07/2019 FINDINGS: A total of 4 saved images are submitted for review. The images demonstrate a flexible endoscope in the descending duodenum with wire cannulation of the pancreatic followed by the common bile duct. Cholangiogram was subsequently performed. No evidence of biliary ductal dilatation, stenosis or stricture. Questionable filling defect which may represent choledocholithiasis. Filling defects are present within the gallbladder consistent with cholelithiasis. The cystic duct remains patent. IMPRESSION: 1. Cholelithiasis 2. Choledocholithiasis. These images were submitted for radiologic interpretation only. Please see the procedural report for the amount of contrast and the fluoroscopy time utilized. Electronically Signed   By: Jacqulynn Cadet M.D.   On: 02/09/2019 08:12    Anti-infectives: Anti-infectives (From admission, onward)   Start     Dose/Rate Route Frequency Ordered Stop   02/09/19 2215  cefTRIAXone (ROCEPHIN) 2 g in sodium chloride 0.9 % 100 mL IVPB     2 g 200 mL/hr over 30 Minutes Intravenous Every 24 hours 02/09/19 1728 02/09/19 2150   02/07/19 2215  cefTRIAXone (ROCEPHIN) 2 g in sodium chloride 0.9 %  100 mL IVPB  Status:  Discontinued     2 g 200 mL/hr over 30 Minutes Intravenous Every 24 hours 02/07/19 2205 02/09/19 1728   02/07/19 2215  metroNIDAZOLE (FLAGYL) IVPB 500 mg  Status:  Discontinued     500 mg 100 mL/hr over 60 Minutes Intravenous Every 8 hours 02/07/19 2205 02/09/19 1728       Assessment/Plan  POD 1, s/p lap chole for cholecystitis, cholelithiasis, choledocholithiasis -s/p ERCP  -doing well today -tolerating diet -pain controlled -surgically stable for DC home -follow up in place and Rx sent to  pharmacy for her.  FEN - regular VTE - SCDs ID - none   LOS: 3 days    Letha Cape , [facility] Surgery 02/10/2019, 9:05 AM Please see Amion for pager number during day hours 7:00am-4:30pm

## 2019-02-11 LAB — SURGICAL PATHOLOGY

## 2019-10-28 IMAGING — US US ABDOMEN LIMITED
1 series · 14 of 25 positions shown · non-contrast
Comparison: None.

CLINICAL DATA: Upper abdominal pain

EXAM:
ULTRASOUND ABDOMEN LIMITED RIGHT UPPER QUADRANT

[Series 1: us abdomen limited · 51 acquisitions, 14 frames shown]
[im 1/51]
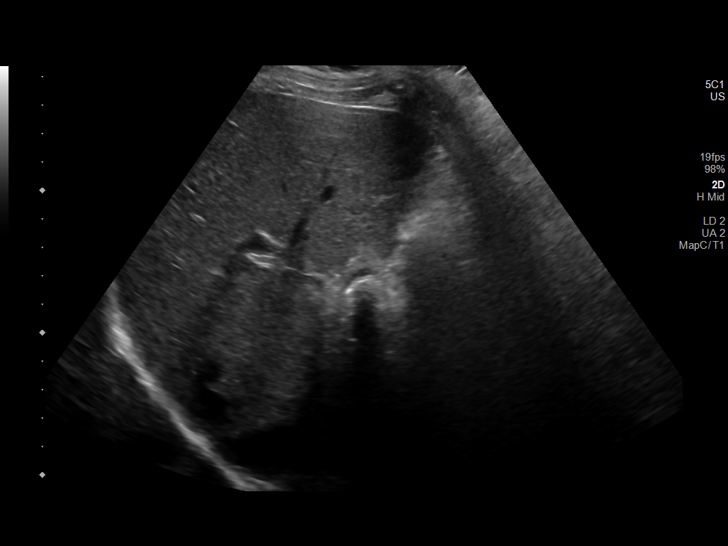
[im 5/51]
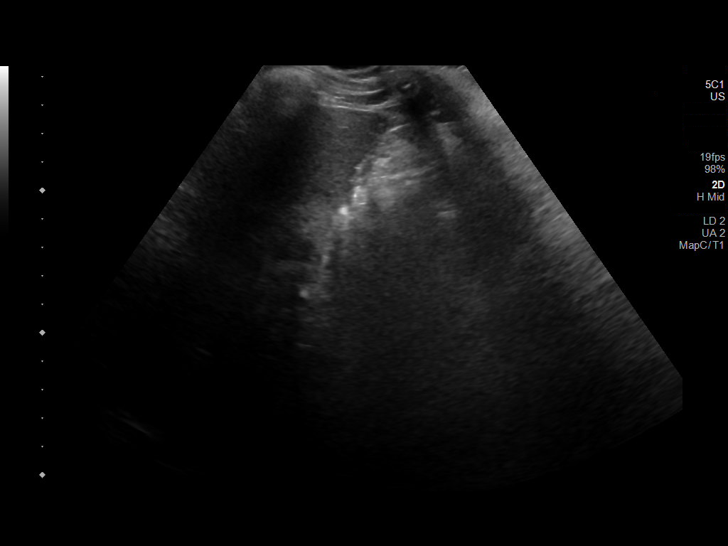
[im 9/51]
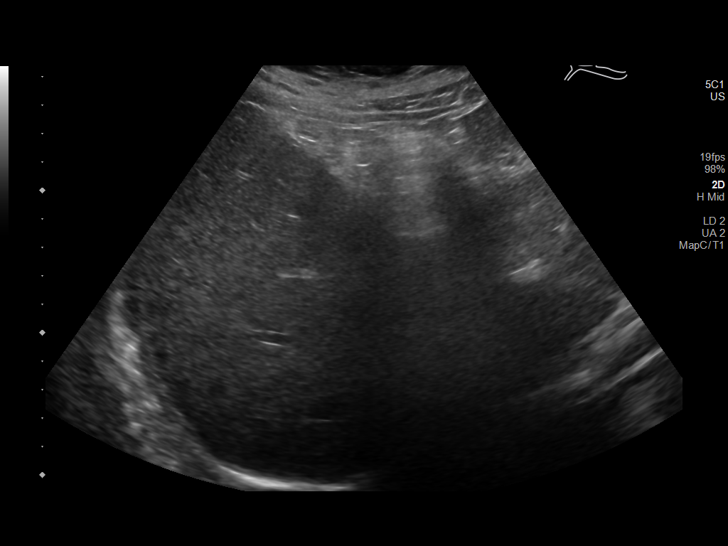
[im 13/51]
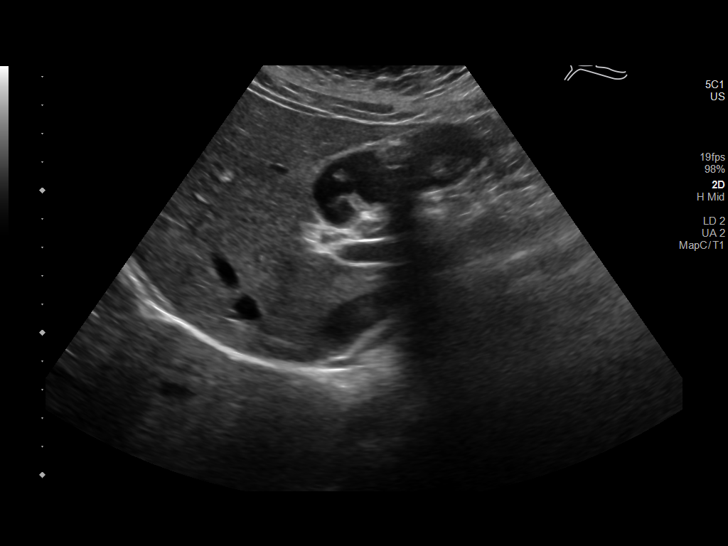
[im 17/51]
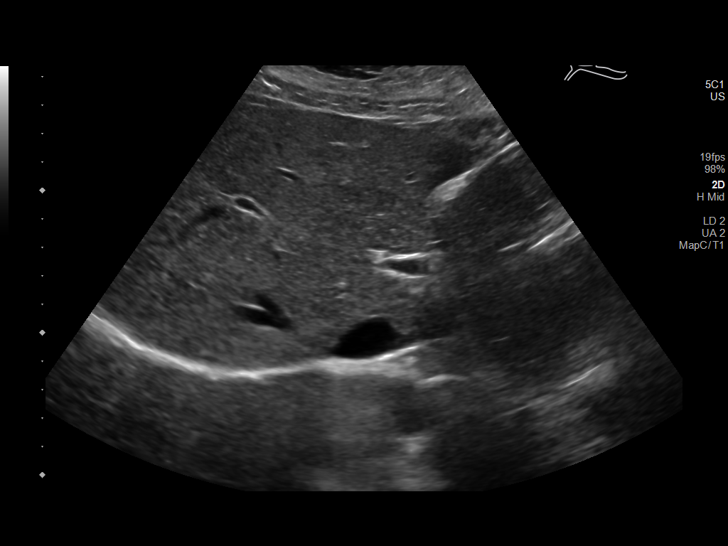
[im 19/51]
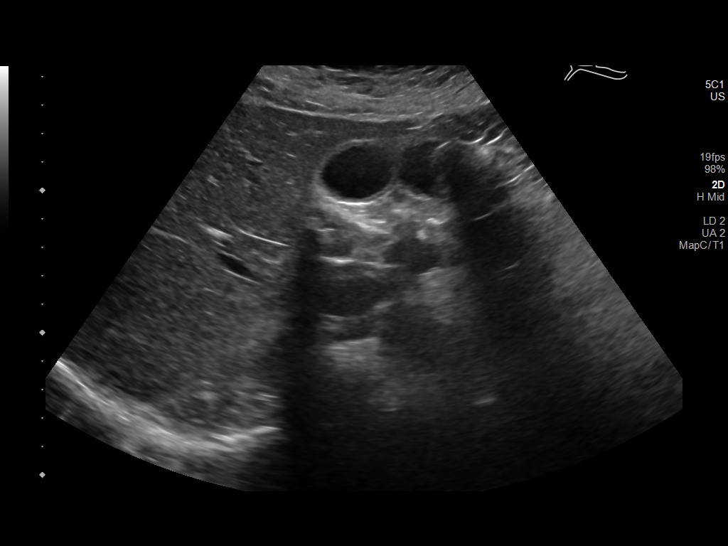
[im 23/51]
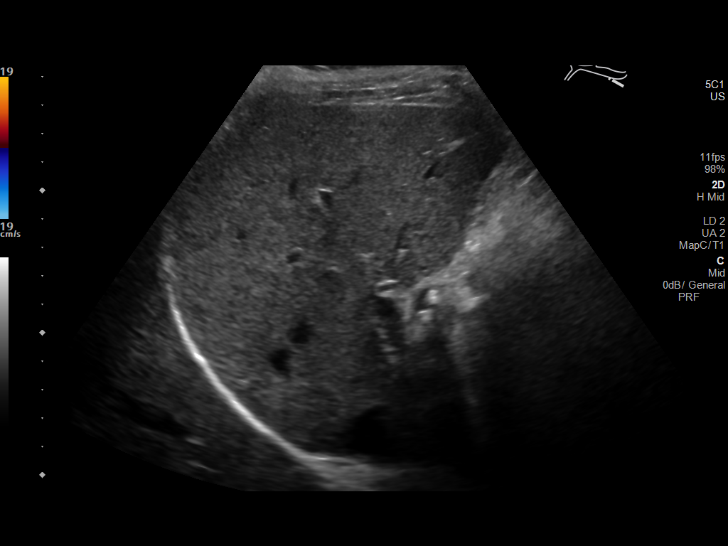
[im 28/51]
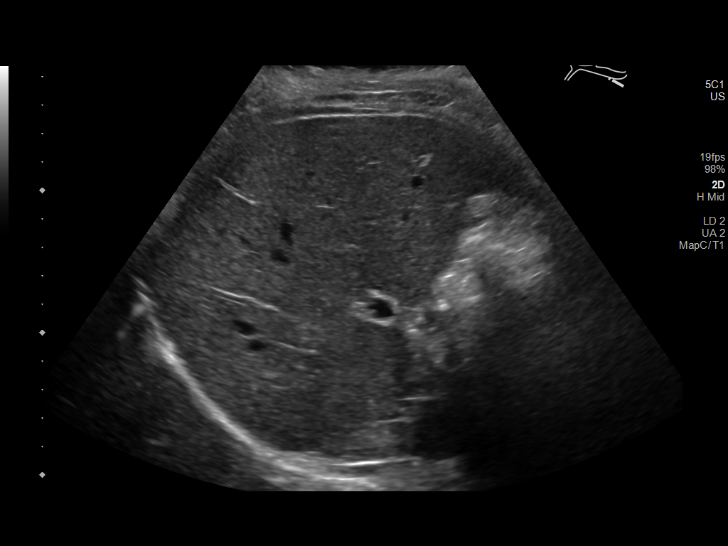
[im 32/51]
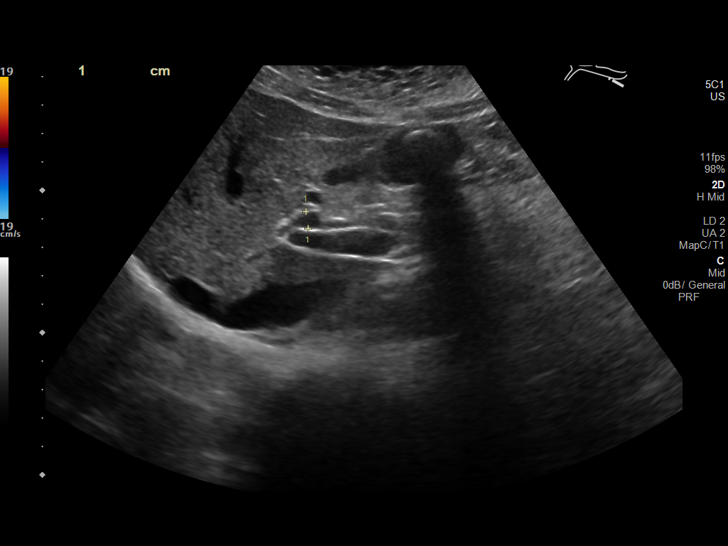
[im 34/51]
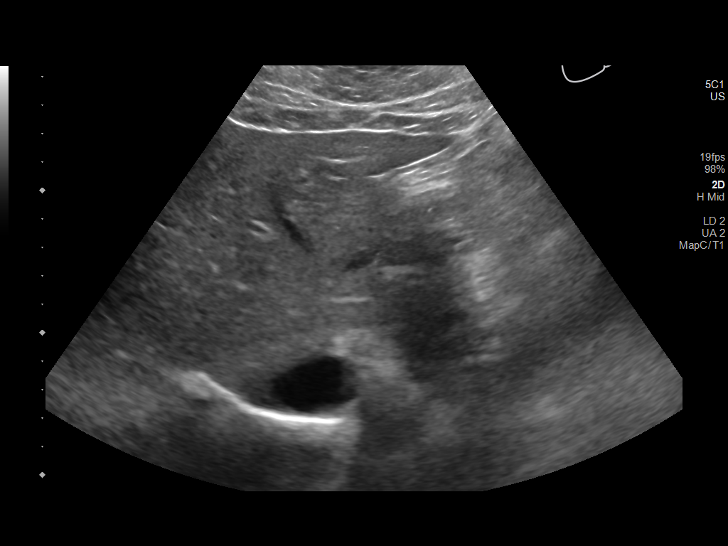
[im 38/51]
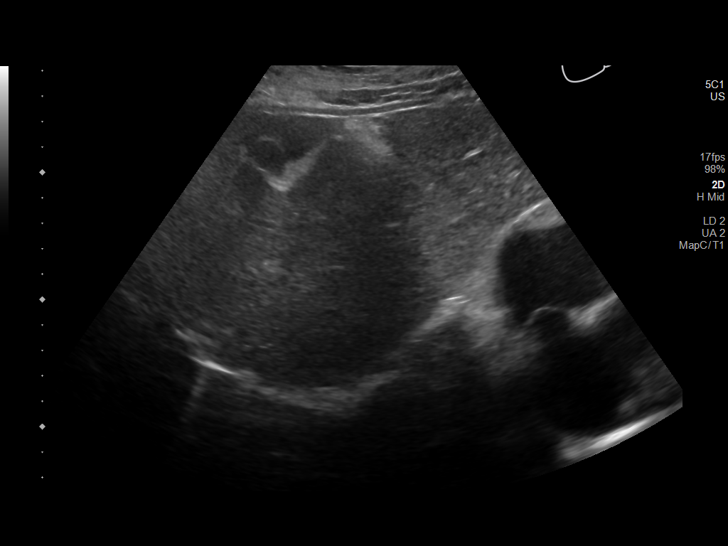
[im 42/51]
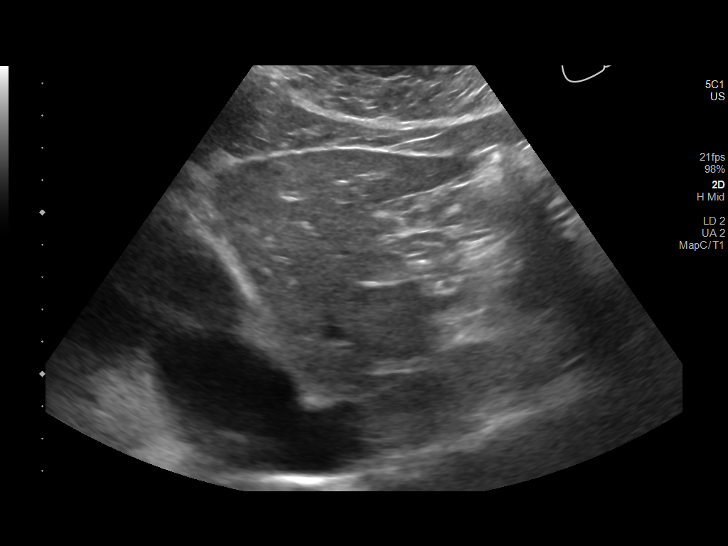
[im 46/51]
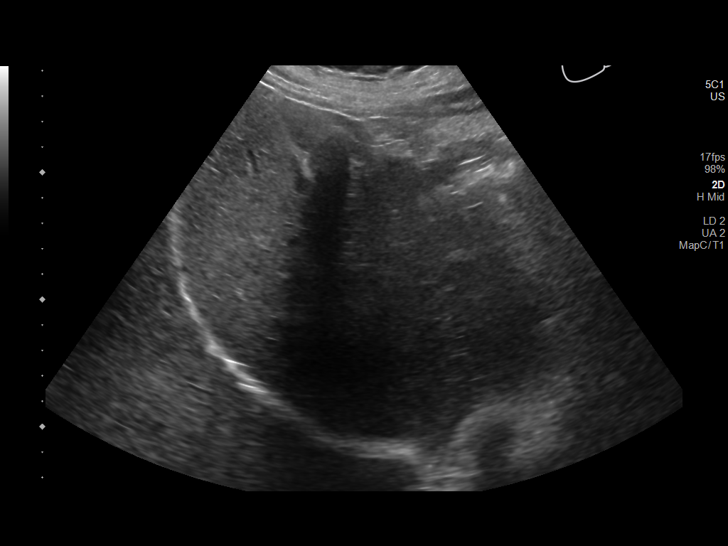
[im 51/51]
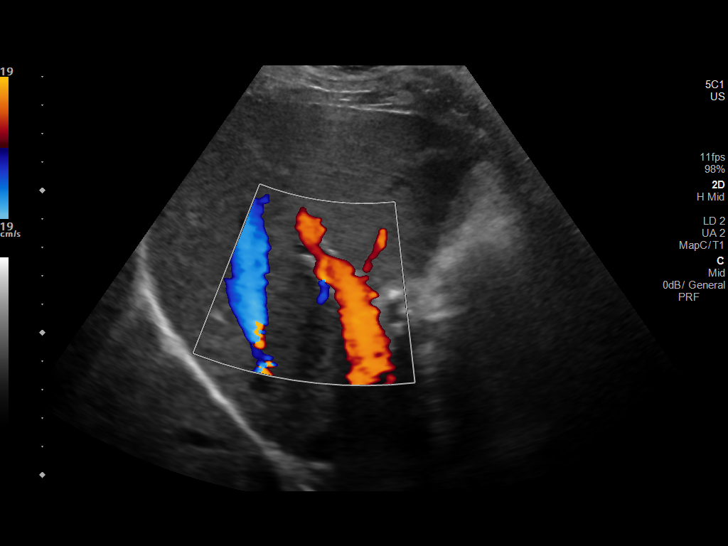

[14 of 25 positions shown; findings below may reference images not displayed]

FINDINGS: Gallbladder:

Multiple echogenic, shadowing gallstones are present within the
gallbladder neck largest measuring up to 17 mm in maximal diameter.
Additional nonshadowing stone or polyp is seen in the mid
gallbladder measuring up to 4 mm. Small amount of pericholecystic
fluid is present. Gallbladder wall is mildly thickened at 5 mm.
Sonographic Murphy sign is reportedly positive.

Common bile duct:

Diameter: 9 mm, dilated. There is an echogenic focus in the mid
extrahepatic CBD measuring up to 6 mm concerning for an intraductal
gallstones

Liver:

Diffusely increased hepatic echogenicity and diminished posterior
through transmission compatible with hepatic steatosis. No focal
lesion identified. Portal vein is patent on color Doppler imaging
with normal direction of blood flow towards the liver.

Other: None.
IMPRESSION: Cholelithiasis and gallbladder polyps with sonographic evidence of
acute cholecystitis.

Extrahepatic biliary ductal dilatation with choledocholithiasis.

Mild hepatic steatosis

## 2019-10-29 IMAGING — RF DG ERCP WO/W SPHINCTEROTOMY
1 series · 4 of 4 positions shown · non-contrast
Comparison: Abdominal ultrasound 02/07/2019

CLINICAL DATA: 28-year-old female with mild right upper quadrant
pain and intermittent nausea

EXAM:
ERCP
TECHNIQUE: Multiple spot images obtained with the fluoroscopic device and
submitted for interpretation post-procedure.
FLUOROSCOPY TIME:  Fluoroscopy Time:  2 minutes 38 seconds reported

[Series 1: unknown protocol · 0.20mm/px · 4 of 4 slices shown]
[im 1/4]
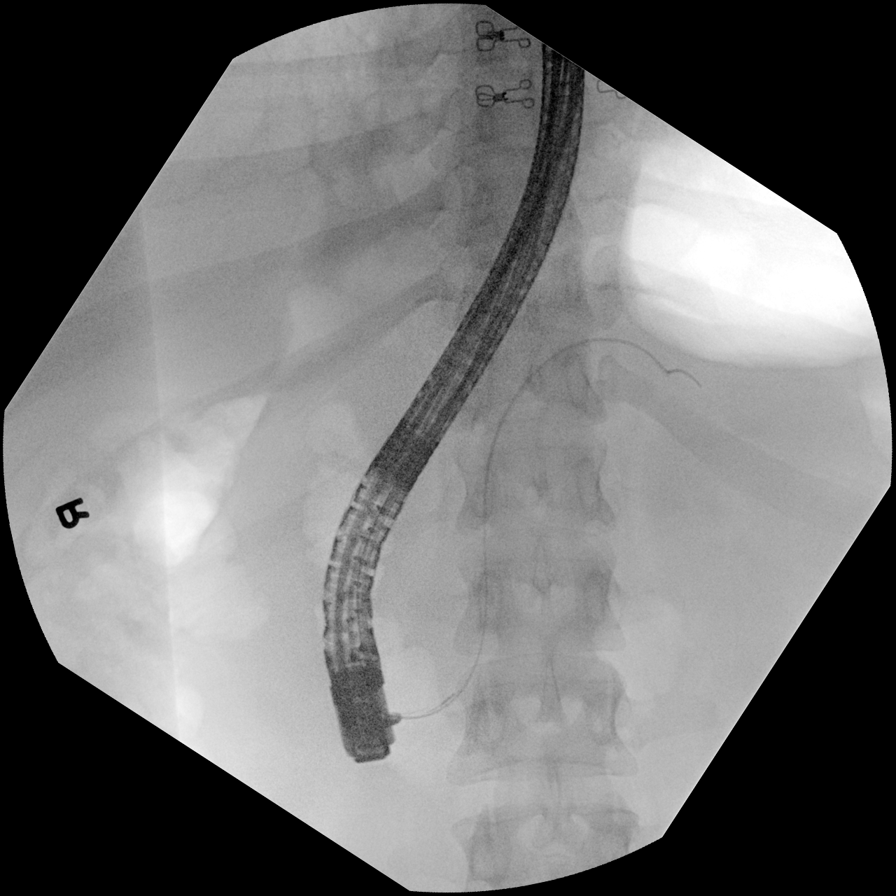
[im 2/4]
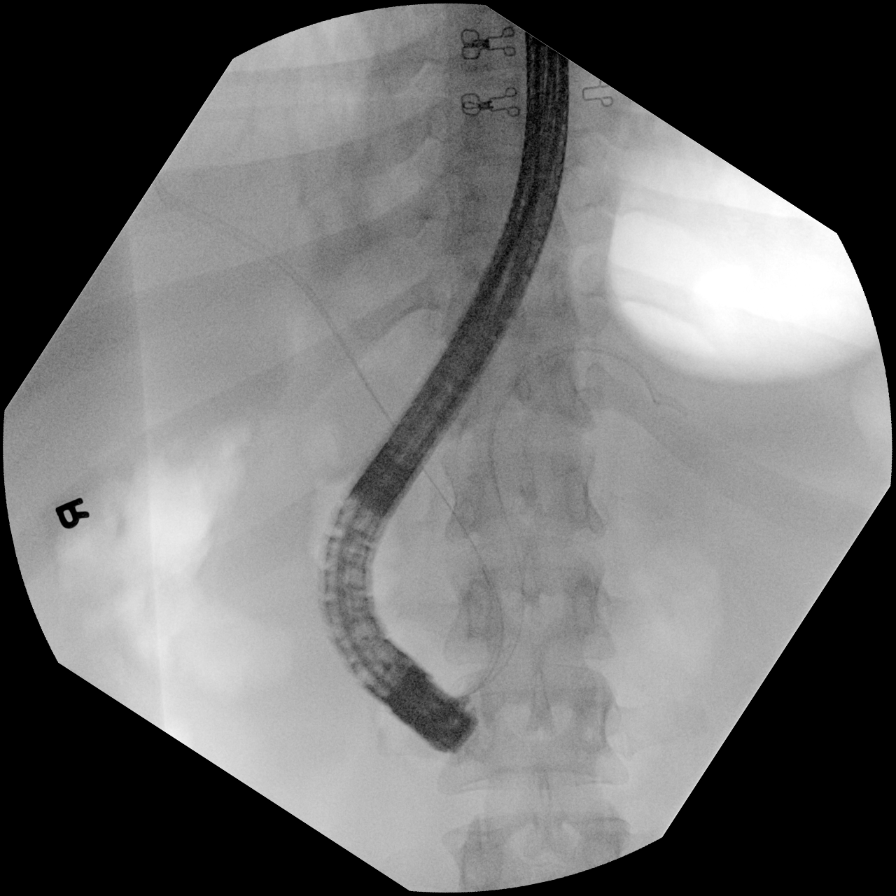
[im 3/4]
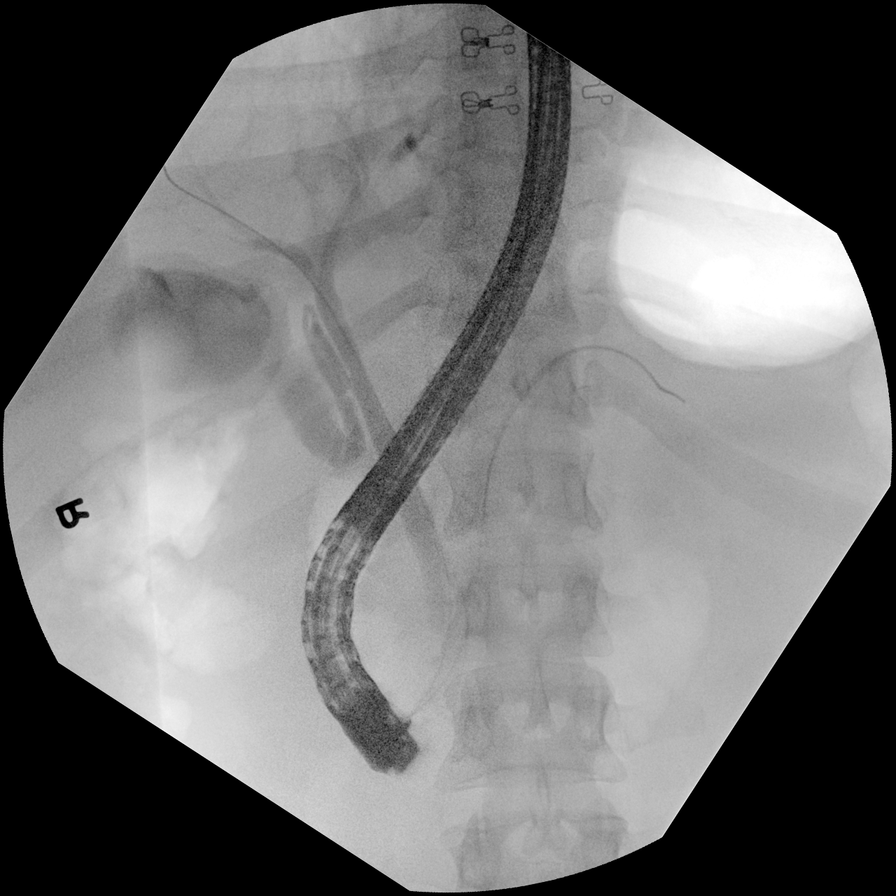
[im 4/4]
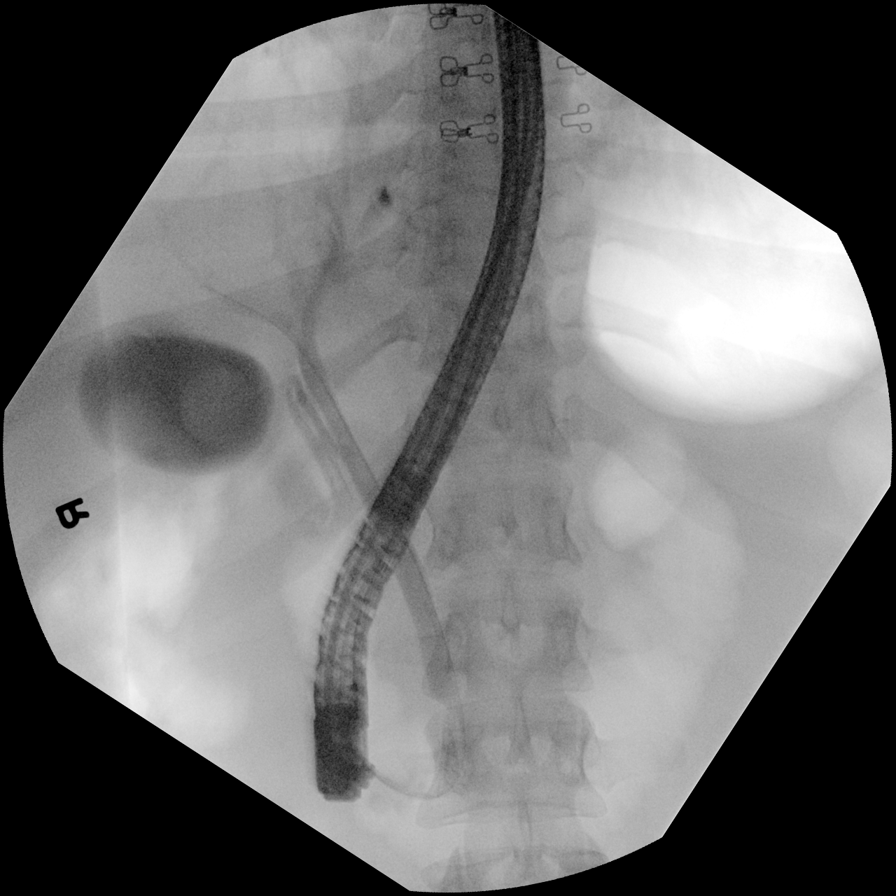

[4 of 4 positions shown; findings below may reference images not displayed]

FINDINGS: A total of 4 saved images are submitted for review. The images
demonstrate a flexible endoscope in the descending duodenum with
wire cannulation of the pancreatic followed by the common bile duct.
Cholangiogram was subsequently performed. No evidence of biliary
ductal dilatation, stenosis or stricture. Questionable filling
defect which may represent choledocholithiasis. Filling defects are
present within the gallbladder consistent with cholelithiasis. The
cystic duct remains patent.
IMPRESSION: 1. Cholelithiasis
2. Choledocholithiasis.

These images were submitted for radiologic interpretation only.
Please see the procedural report for the amount of contrast and the
fluoroscopy time utilized.

## 2020-04-21 ENCOUNTER — Other Ambulatory Visit: Payer: Self-pay

## 2020-04-21 DIAGNOSIS — Z20822 Contact with and (suspected) exposure to covid-19: Secondary | ICD-10-CM

## 2020-04-23 LAB — NOVEL CORONAVIRUS, NAA: SARS-CoV-2, NAA: DETECTED — AB

## 2020-04-23 LAB — SARS-COV-2, NAA 2 DAY TAT

## 2022-09-22 ENCOUNTER — Ambulatory Visit (HOSPITAL_COMMUNITY)
Admission: EM | Admit: 2022-09-22 | Discharge: 2022-09-22 | Disposition: A | Discharge status: Home / Self Care | Payer: Self-pay | Attending: Emergency Medicine | Admitting: Emergency Medicine

## 2022-09-22 ENCOUNTER — Encounter (HOSPITAL_COMMUNITY): Payer: Self-pay | Admitting: *Deleted

## 2022-09-22 ENCOUNTER — Other Ambulatory Visit: Payer: Self-pay

## 2022-09-22 DIAGNOSIS — S0101XA Laceration without foreign body of scalp, initial encounter: Secondary | ICD-10-CM

## 2022-09-22 DIAGNOSIS — S0990XA Unspecified injury of head, initial encounter: Secondary | ICD-10-CM

## 2022-09-22 MED ORDER — LIDOCAINE HCL (PF) 1 % IJ SOLN
INTRAMUSCULAR | Status: AC
  Filled 2022-09-22: qty 2
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
## 2022-09-22 NOTE — ED Notes (Signed)
PT A/O x 4 . Pupils equal and reactive to light.

## 2022-09-22 NOTE — Discharge Instructions (Addendum)
Has been closed by staples, 3 placed, please return in 7 days for removal  No signs of a concussion at this time however if you begin to experience worsening dizziness, you pass out, you begin vomiting, you begin to feel very sleepy or you begin to have any visual changes please go to the nearest emergency department for immediate evaluation  You may wash your hair after 24 hours, gently cleanse with soap and water and pat dry, do not rub  For any headache she may take Tylenol and or Motrin as needed  Please have someone in your house to check area at least once today, if you begin to notice any redness, increased swelling or puslike drainage please return for reevaluation

## 2022-09-29 ENCOUNTER — Encounter (HOSPITAL_COMMUNITY): Payer: Self-pay | Admitting: *Deleted

## 2022-09-29 ENCOUNTER — Other Ambulatory Visit: Payer: Self-pay

## 2022-09-29 ENCOUNTER — Ambulatory Visit (HOSPITAL_COMMUNITY)
Admission: EM | Admit: 2022-09-29 | Discharge: 2022-09-29 | Disposition: A | Discharge status: Home / Self Care | Payer: Self-pay

## 2022-09-29 DIAGNOSIS — Z4802 Encounter for removal of sutures: Secondary | ICD-10-CM

## 2022-09-29 NOTE — ED Triage Notes (Signed)
Pt present for staple removal to scalp. Staples were place at [facility] on 09-22-22. Skin clean dry and intact. 3 staples present.
